# Patient Record
Sex: Male | Born: 1955 | Race: White | Hispanic: No | Smoking: Never smoker
Health system: Southern US, Community
[De-identification: ages and names within clinical notes are randomized; demographics above are authoritative.]

## PROBLEM LIST (undated history)

## (undated) DIAGNOSIS — E785 Hyperlipidemia, unspecified: Secondary | ICD-10-CM

## (undated) DIAGNOSIS — J449 Chronic obstructive pulmonary disease, unspecified: Secondary | ICD-10-CM

## (undated) DIAGNOSIS — M109 Gout, unspecified: Secondary | ICD-10-CM

## (undated) DIAGNOSIS — IMO0001 Reserved for inherently not codable concepts without codable children: Secondary | ICD-10-CM

## (undated) DIAGNOSIS — I10 Essential (primary) hypertension: Secondary | ICD-10-CM

## (undated) DIAGNOSIS — R0602 Shortness of breath: Secondary | ICD-10-CM

## (undated) DIAGNOSIS — G600 Hereditary motor and sensory neuropathy: Secondary | ICD-10-CM

## (undated) DIAGNOSIS — Z87442 Personal history of urinary calculi: Secondary | ICD-10-CM

## (undated) DIAGNOSIS — M549 Dorsalgia, unspecified: Secondary | ICD-10-CM

## (undated) DIAGNOSIS — M25512 Pain in left shoulder: Secondary | ICD-10-CM

## (undated) DIAGNOSIS — F419 Anxiety disorder, unspecified: Secondary | ICD-10-CM

## (undated) DIAGNOSIS — G8929 Other chronic pain: Secondary | ICD-10-CM

## (undated) DIAGNOSIS — K219 Gastro-esophageal reflux disease without esophagitis: Secondary | ICD-10-CM

## (undated) HISTORY — PX: CARDIAC CATHETERIZATION: SHX172

## (undated) HISTORY — DX: Hyperlipidemia, unspecified: E78.5

## (undated) HISTORY — DX: Essential (primary) hypertension: I10

## (undated) HISTORY — DX: Gastro-esophageal reflux disease without esophagitis: K21.9

## (undated) HISTORY — DX: Anxiety disorder, unspecified: F41.9

---

## 1998-04-10 ENCOUNTER — Emergency Department (HOSPITAL_COMMUNITY): Admission: EM | Admit: 1998-04-10 | Discharge: 1998-04-10 | Payer: Self-pay | Admitting: Emergency Medicine

## 1998-04-10 ENCOUNTER — Encounter: Payer: Self-pay | Admitting: Emergency Medicine

## 1998-07-28 ENCOUNTER — Inpatient Hospital Stay (HOSPITAL_COMMUNITY): Admission: AD | Admit: 1998-07-28 | Discharge: 1998-07-29 | Payer: Self-pay | Admitting: Cardiology

## 2000-02-29 ENCOUNTER — Inpatient Hospital Stay (HOSPITAL_COMMUNITY): Admission: EM | Admit: 2000-02-29 | Discharge: 2000-03-01 | Payer: Self-pay | Admitting: Cardiology

## 2000-07-17 ENCOUNTER — Emergency Department (HOSPITAL_COMMUNITY): Admission: EM | Admit: 2000-07-17 | Discharge: 2000-07-18 | Payer: Self-pay | Admitting: *Deleted

## 2000-07-18 ENCOUNTER — Encounter: Payer: Self-pay | Admitting: *Deleted

## 2000-07-18 ENCOUNTER — Encounter: Payer: Self-pay | Admitting: Internal Medicine

## 2000-07-20 ENCOUNTER — Ambulatory Visit (HOSPITAL_COMMUNITY): Admission: RE | Admit: 2000-07-20 | Discharge: 2000-07-20 | Payer: Self-pay | Admitting: General Surgery

## 2000-07-20 ENCOUNTER — Encounter: Payer: Self-pay | Admitting: General Surgery

## 2001-10-19 ENCOUNTER — Emergency Department (HOSPITAL_COMMUNITY): Admission: EM | Admit: 2001-10-19 | Discharge: 2001-10-19 | Payer: Self-pay | Admitting: Internal Medicine

## 2002-03-22 ENCOUNTER — Ambulatory Visit (HOSPITAL_COMMUNITY): Admission: RE | Admit: 2002-03-22 | Discharge: 2002-03-22 | Payer: Self-pay | Admitting: Pulmonary Disease

## 2003-06-04 ENCOUNTER — Encounter (HOSPITAL_COMMUNITY): Admission: RE | Admit: 2003-06-04 | Discharge: 2003-06-05 | Payer: Self-pay | Admitting: Internal Medicine

## 2004-07-26 ENCOUNTER — Ambulatory Visit: Payer: Self-pay | Admitting: Internal Medicine

## 2004-08-02 ENCOUNTER — Ambulatory Visit (HOSPITAL_COMMUNITY): Admission: RE | Admit: 2004-08-02 | Discharge: 2004-08-02 | Payer: Self-pay | Admitting: Internal Medicine

## 2004-08-02 ENCOUNTER — Ambulatory Visit: Payer: Self-pay | Admitting: Internal Medicine

## 2004-08-22 ENCOUNTER — Ambulatory Visit: Payer: Self-pay | Admitting: Internal Medicine

## 2005-03-09 ENCOUNTER — Emergency Department (HOSPITAL_COMMUNITY): Admission: EM | Admit: 2005-03-09 | Discharge: 2005-03-09 | Payer: Self-pay | Admitting: Emergency Medicine

## 2005-03-24 ENCOUNTER — Ambulatory Visit: Payer: Self-pay | Admitting: Cardiology

## 2005-03-24 ENCOUNTER — Ambulatory Visit (HOSPITAL_COMMUNITY): Admission: RE | Admit: 2005-03-24 | Discharge: 2005-03-24 | Payer: Self-pay | Admitting: Pulmonary Disease

## 2005-04-13 ENCOUNTER — Ambulatory Visit (HOSPITAL_COMMUNITY): Admission: RE | Admit: 2005-04-13 | Discharge: 2005-04-13 | Payer: Self-pay | Admitting: Pulmonary Disease

## 2005-05-29 ENCOUNTER — Ambulatory Visit: Payer: Self-pay | Admitting: Internal Medicine

## 2005-06-01 ENCOUNTER — Encounter (HOSPITAL_COMMUNITY): Admission: RE | Admit: 2005-06-01 | Discharge: 2005-07-01 | Payer: Self-pay | Admitting: Internal Medicine

## 2005-12-27 ENCOUNTER — Ambulatory Visit (HOSPITAL_COMMUNITY): Admission: RE | Admit: 2005-12-27 | Discharge: 2005-12-27 | Payer: Self-pay | Admitting: Pulmonary Disease

## 2009-12-13 ENCOUNTER — Encounter: Payer: Self-pay | Admitting: Cardiology

## 2009-12-15 ENCOUNTER — Encounter: Payer: Self-pay | Admitting: *Deleted

## 2009-12-15 ENCOUNTER — Ambulatory Visit: Payer: Self-pay | Admitting: Cardiovascular Disease

## 2009-12-15 DIAGNOSIS — R0602 Shortness of breath: Secondary | ICD-10-CM | POA: Insufficient documentation

## 2009-12-15 DIAGNOSIS — E1165 Type 2 diabetes mellitus with hyperglycemia: Secondary | ICD-10-CM | POA: Insufficient documentation

## 2009-12-15 DIAGNOSIS — R079 Chest pain, unspecified: Secondary | ICD-10-CM

## 2010-03-08 ENCOUNTER — Ambulatory Visit (HOSPITAL_COMMUNITY)
Admission: RE | Admit: 2010-03-08 | Discharge: 2010-03-08 | Payer: Self-pay | Source: Home / Self Care | Admitting: Pulmonary Disease

## 2010-04-24 ENCOUNTER — Encounter (INDEPENDENT_AMBULATORY_CARE_PROVIDER_SITE_OTHER): Payer: Self-pay | Admitting: Internal Medicine

## 2010-04-24 ENCOUNTER — Encounter: Payer: Self-pay | Admitting: Cardiovascular Disease

## 2010-05-03 NOTE — Assessment & Plan Note (Signed)
Summary: ***NP6 CHEST PAIN   Visit Type:  Initial Consult Primary Provider:  Nehemiah Settle  CC:  chest pain.  History of Present Illness: Dwayne Davies is seen today at the request of Dr Juanetta Gosling.  He has had atypical SSCP.  He has had two previously normal heart caths.  Last being in 2001.  He has been followed by Dr Daleen Squibb and Dorethea Clan in the past.  He  is sedentary and morbidly obese.  He lives with his son and neighter one cook so he has frozen dinners and eats out all the time.  He has type two diabetes and he doesnt watch his BS at home.  His pain is actually more left shoulder and arm.  It seems more muscular.  Chronic exertional dyspnea from weight   Clinical sleep apnea with loud snoring.  Mild chronic LE dependant edema.  Has had SSCP from relux and hiatal hernia in past but this is different.  Current Problems (verified): 1)  Chest Pain  (ICD-786.50) 2)  Dm  (ICD-250.00) 3)  Dyspnea  (ICD-786.05)  Current Medications (verified): 1)  Prilosec 20 Mg Cpdr (Omeprazole) .... Take 1 Tab Daily 2)  Xanax 0.25 Mg Tabs (Alprazolam) .... Take As Directed 3)  Glucovance 5-500 Mg Tabs (Glyburide-Metformin) .... Take 1 Tab Two Times A Day  Allergies (verified): No Known Drug Allergies  Past History:  Past Medical History: gerds anxiety chest pain Type 2 DM Elevated Lipids  Family History: No premature CAD  Social History: Tobacco Use - No.  Alcohol Use - no Regular Exercise - no Drug Use - no Widowed Lives with only son  Review of Systems       Denies fever, malais, weight loss, blurry vision, decreased visual acuity, cough, sputum, hemoptysis, pleuritic pain, palpitaitons, heartburn, abdominal pain, melena, lower extremity claudication, or rash.   Vital Signs:  Patient profile:   55 year old male Height:      65 inches Weight:      261 pounds BMI:     43.59 Pulse rate:   94 / minute BP sitting:   141 / 86  (right arm)  Vitals Entered By: Dreama Saa, CNA (December 15, 2009 9:36 AM)  Physical Exam  General:  Affect appropriate Healthy:  appears stated age HEENT: normal Neck supple with no adenopathy JVP normal no bruits no thyromegaly Lungs clear with no wheezing and good diaphragmatic motion Heart:  S1/S2 no murmur,rub, gallop or click PMI normal Abdomen: benighn, BS positve, no tenderness, no AAA no bruit.  No HSM or HJR Distal pulses intact with no bruits No edema Neuro non-focal Skin warm and dry    Impression & Recommendations:  Problem # 1:  CHEST PAIN (ICD-786.50) Atypical.  Lexiscan myovue  Does not think he can walk on treadmill Orders: Nuclear Stress Test (Nuc Stress Test)  Problem # 2:  DM (ICD-250.00) Encouraged him to monitor BS at home and explained to him the relationship between his obesity, poor diet and type 2 diabetes HbA1c quaterly.   His updated medication list for this problem includes:    Glucovance 5-500 Mg Tabs (Glyburide-metformin) .Marland Kitchen... Take 1 tab two times a day  Problem # 3:  DYSPNEA (ICD-786.05) Related to obesity.  Likely sleep apnea.  F/U primary to consider sleep study Orders: Nuclear Stress Test (Nuc Stress Test)  Patient Instructions: 1)  Your physician recommends that you schedule a follow-up appointment in: as needed  2)  Your physician recommends that you continue on your current  medications as directed. Please refer to the Current Medication list given to you today. 3)  Your physician has requested that you have an [employer]  For further information please visit [email or web address]  Please follow instruction sheet, as given.   EKG Report  Procedure date:  12/15/2009  Findings:      NSR 91 Poor R wave progression Abnormal ECG

## 2010-05-03 NOTE — Letter (Signed)
Summary: DR Juanetta Gosling PROGRESS NOTE 12/01/09  DR HAWKINS PROGRESS NOTE 12/01/09   Imported By: Faythe Ghee 12/15/2009 15:20:58  _____________________________________________________________________  External Attachment:    Type:   Image     Comment:   External Document

## 2010-05-03 NOTE — Letter (Signed)
Summary: DR Juanetta Gosling PROGRESS NOTE 12/01/09  DR HAWKINS PROGRESS NOTE 12/01/09   Imported By: Faythe Ghee 12/13/2009 12:33:45  _____________________________________________________________________  External Attachment:    Type:   Image     Comment:   External Document

## 2010-05-03 NOTE — Letter (Signed)
Summary: ekg 12/01/09  ekg 12/01/09   Imported By: Faythe Ghee 12/15/2009 15:20:31  _____________________________________________________________________  External Attachment:    Type:   Image     Comment:   External Document

## 2010-05-03 NOTE — Letter (Signed)
Summary: LABS 12/01/09  LABS 12/01/09   Imported By: Faythe Ghee 12/15/2009 15:21:15  _____________________________________________________________________  External Attachment:    Type:   Image     Comment:   External Document

## 2010-05-03 NOTE — Letter (Signed)
Summary: [facility] (Nuc Med Stress)  [facility]  [address], [place] [address]   Phone: [phone]  Fax: [phone]    Nuclear Medicine 1-Day Stress Test Information Sheet  Re:     Dwayne Davies   DOB:     03-Nov-1955 MRN:     [id number] Weight:  Appointment Date: Register at: Appointment Time: Referring MD:  ___Exercise Stress  __Adenosine   __Dobutamine  _X_Lexiscan  __Persantine   __Thallium  Urgency: ____1 (next day)   ____2 (one week)    ____3 (PRN)  Patient will receive Follow Up call with results: Patient needs follow-up appointment:  Instructions regarding medication:  How to prepare for your stress test: 1. DO NOT eat or dring 6 hours prior to your arrival time. This includes no caffeine (coffee, tea, sodas, chocolate) if you were instructed to take your medications, drink water with it. 2. DO NOT use any tobacco products for at leaset 8 hours prior to arrival. 3. DO NOT wear dresses or any clothing that may have metal clasps or buttons. 4. Wear short sleeve shirts, loose clothing, and comfortalbe walking shoes. 5. DO NOT use lotions, oils or powder on your chest before the test. 6. The test will take approximately 3-4 hours from the time you arrive until completion. 7. To register the day of the test, go to the Short Stay entrance at [facility]. 8. If you must cancel your test, call [phone] as soon as you are aware.  After you arrive for test:   When you arrive at [facility], you will go to Short Stay to be registered. They will then send you to Radiology to check in. The Nuclear Medicine Tech will get you and start an IV in your arm or hand. A small amount of a radioactive tracer will then be injected into your IV. This tracer will then have to circulate for 30-45 minutes. During this time you will wait in the waiting room and you will be able to drink something without caffeine. A series of pictures will be taken  of your heart follwoing this waiting period. After the 1st set of pictures you will go to the stress lab to get ready for your stress test. During the stress test, another small amount of a radioactive tracer will be injected through your IV. When the stress test is complete, there is a short rest period while your heart rate and blood pressure will be monitored. When this monitoring period is complete you will have another set of pictrues taken. (The same as the 1st set of pictures). These pictures are taken between 15 minutes and 1 hour after the stress test. The time depends on the type of stress test you had. Your doctor will inform you of your test results within 7 days after test.    The possibilities of certain changes are possible during the test. They include abnormal blood pressure and disorders of the heart. Side effects of persantine or adenosine can include flushing, chest pain, shortness of breath, stomach tightness, headache and light-headedness. These side effects usually do not last long and are self-resolving. Every effort will be made to keep you comfortable and to minimize complications by obtaining a medical history and by close observation during the test. Emergency equipment, medications, and trained personnel are available to deal with any unusual situation which may arise.  Please notify office at least 48 hours in advance if you are unable to keep  this appt.

## 2010-08-19 NOTE — Procedures (Signed)
NAME:  Dwayne Davies, BIRNIE NO.:  [id number]   MEDICAL RECORD NO.:  [id number]          PATIENT TYPE:  OUT   LOCATION:  RAD                           FACILITY:  [facility]   PHYSICIAN:  Olga Millers, M.D. LHCDATE OF BIRTH:  12-28-1955   DATE OF PROCEDURE:  03/24/2005  DATE OF DISCHARGE:  03/09/2005                                  ECHOCARDIOGRAM   HISTORY OF PRESENT ILLNESS:  The patient is a 55 year old male who is  complaining of dyspnea.  This study is performed to quantify left  ventricular function.  It is technically adequate.  The two dimensional  measurements are as follows:  Left ventricular end diastolic dimension is  4.3 cm, left ventricular end systolic dimension is 3.1 cm.  The septum and  posterior wall measure 1.3 cm.  The aorta is 3.4 cm.  The aorta is 3.4 cm.  The left atrium is 5 cm.   The overall left ventricular function appears to be normal with an estimated  ejection fraction of 55-60%.  There is mild concentric left ventricular  hypertrophy.  There is mild left atrial enlargement.  The right atrium and  right ventricle also appear to be mildly enlarged, and the right ventricular  function is moderately reduced.  There is no pericardial effusion.  The  aortic valve is mildly sclerotic, but there is no aortic stenosis or aortic  insufficiency.  There was no significant mitral regurgitation or tricuspid  regurgitation.   FINAL INTERPRETATION:  1.  Normal left ventricular function.  2.  Mild left ventricular hypertrophy.  3.  Mild biatrial enlargement.  4.  Mild right ventricular enlargement with moderately reduced RV function.  5.  No significant valvular regurgitation.           ______________________________  Olga Millers, M.D. [facility]     BC/MEDQ  D:  03/24/2005  T:  03/24/2005  Job:  [id number]

## 2010-08-19 NOTE — Op Note (Signed)
NAME:  [name], [name]                ACCOUNT NO.:  [id number]   MEDICAL RECORD NO.:  [id number]          PATIENT TYPE:  AMB   LOCATION:  DAY                           FACILITY:  [facility]   PHYSICIAN:  Lionel December, M.D.    DATE OF BIRTH:  1955/06/14   DATE OF PROCEDURE:  08/02/2004  DATE OF DISCHARGE:                                 OPERATIVE REPORT   PROCEDURE:  Esophagogastroduodenoscopy with esophageal dilation.   INDICATION:  Dwayne Davies is a 55 year old Caucasian male with chronic GERD who  also has intermittent solid food dysphagia. Since he has been on double dose  PPI, he has noted significant improvement but still has regurgitation in  addition the dysphagia. He has history of ulcerative esophagitis and  esophageal stricture.   Procedure risks were reviewed with the patient, and informed consent was  obtained.   PREMEDICATION:  Cetacaine spray for pharyngeal topical anesthesia, Demerol  50 mg IV, Versed 10 mg IV in divided dose.   FINDINGS:  Procedure performed in endoscopy suite. The patient's vital signs  and O2 saturation were monitored during the procedure and remained stable.  The patient was placed in left lateral position. Olympus videoscope was  passed via oropharynx without any difficulty into esophagus.   Esophagus. Mucosa of the proximal middle third was normal. Distally, there  were three discrete erosions just proximal to GE junction. There was a  stricture at GE junction which was at 37-38 cm. Hiatus was at 39-40. A small  sliding hiatal hernia was noted.   Stomach. It was empty and distended very well insufflation. Folds of  proximal stomach were normal. Examination of mucosa at body, antrum, pyloric  channel as well as angularis, fundus and cardia was normal.   Duodenum. Bulbar mucosa was normal. Scope was passed to the second part of  duodenum where mucosa and folds were normal. Endoscope was withdrawn.   Esophagus was dilated by passing 56-French Maloney  dilator to full  insertion. As the dilator was withdrawn and disrupted at three points.  Pictures taken for the record. Endoscope was withdrawn. The patient  tolerated the procedure well.   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 Stricture dilated to 56-French with  Dwayne Davies.   RECOMMENDATIONS:  1.  He will continue antireflux measures with Nexium 40 mg b.i.d.  2.  Solid phase gastric emptying study.      NR/MEDQ  D:  08/02/2004  T:  08/02/2004  Job:  [id number]   cc:   Juanetta Gosling, M.D.

## 2010-08-19 NOTE — Discharge Summary (Signed)
[place]  Patient:    Dwayne Davies, Dwayne Davies                       MRN: [id number] Adm. Date:  [id number] Disc. Date: 03/01/00 Attending:  Mirian Mo Dictator:   Brita Romp, P.A.C. CC:         Kari Baars, M.D.  at [address] [place], [place]  [address]  Jesse Sans. Wall, M.D. [facility]   Discharge Summary  DISCHARGE DIAGNOSES: 1. Gastroesophageal reflux disease. 2. Anxiety.  HOSPITAL COURSE:  The patient presented to the emergency room at [facility] on February 29, 2000 in the morning complaining of some exertional chest pain.  He was seen by Dr. Jesse Sans. Wall.  Dr. Jesse Sans. Wall felt that his symptoms were somewhat atypical but they did improve with nitroglycerin. Dr. Jesse Sans. Wall was concerned about possible CAD and arranged for the patient to be transported to [facility] for further evaluation.  The patient was taken to the catheterization lab on March 01, 2000 by Dr. Everardo Beals. Brodie.  The results were no significant coronary artery disease. Normal left ventricular function with good wall motion.  No areas of hypokinesis.  Ejection fraction estimated to be 60%.  Postcatheterization, the patient complained of some lower abdominal pain and inability to void.  The catheter site was also noted to be oozing some slightly.  There was no sign of swelling or aneurysm.  Initially, bed rest was extended to 10:30.  However, oozing continued somewhat.  Per Dr. Everardo Beals. Brodie bed rest was continued until 5:30 in the evening.  Later that afternoon, the patient reported a relief of abdominal pain with some raising the head of bed; however, there was still some minor oozing from the catheter site.  The dressing was removed using sterile technique.  The area adjacent to the site was infiltrated with approximately 1 cc of 1% lidocaine with epinephrine 1:100,000.  A sterile dressing was put in place and then  covered with an Op-Site.  Bed rest was continued until 5:30.  At that time, dressing was dry and intact. No evidence of hematoma or continued oozing.  DISCHARGE MEDICATIONS: 1. Prilosec 20 mg 1 q.d. 2. Xanax 0.25 mg p.r.n. as previously directed.  LABORATORY DATA:  Laboratory values from [facility] show white count 7.4, hemoglobin 16, hematocrit 46.9, platelets 304,000.  Cardiac enzymes negative for MI.  Sodium 139, potassium 4.0, chloride 101, CO2 27, BUN 9, creatinine 0.9, and glucose 96.  Noted a 2-D echocardiogram was performed late in the afternoon of March 01, 2000.  The results were not available at the time of dictation.  CONDITION ON DISCHARGE:  The patient was discharged home in stable condition.DD:  03/01/00 TD:  03/01/00 Job: [id number]

## 2010-08-19 NOTE — Procedures (Signed)
NAME:  Dwayne Davies, Dwayne Davies NO.:  [id number]   MEDICAL RECORD NO.:  [id number]                  PATIENT TYPE:   LOCATION:                                       FACILITY:   PHYSICIAN:  Pricilla Riffle, M.D.                 DATE OF BIRTH:   DATE OF PROCEDURE:  DATE OF DISCHARGE:                                    STRESS TEST   PROCEDURE:  Adenosine Cardiolite.   INDICATION:  The patient is a 55 year old gentleman with no known coronary  artery disease, with normal catheterization in November of 2001, now with  increased dyspnea on exertion.  His cardiac risk factors include a family  history of dyslipidemia and obesity.   BASELINE DATA:  EKG reveals sinus rhythm at 85 beats per minute with  nonspecific ST abnormalities.  Blood pressures 148/88.   Adenosine 60 mg was infused over a four minute protocol with Cardiolite  injected at three minutes.  The patient reported shortness of breath and  flushing which resolved in recovery.  EKG revealed no ischemic changes and  no arrhythmias.   FINAL IMAGES AND RESULTS:  Pending M.D. review.     ________________________________________  ___________________________________________  Jae Dire, P.A. [facility]                      Pricilla Riffle, M.D.   AB/MEDQ  D:  06/04/2003  T:  06/04/2003  Job:  [id number]

## 2010-08-19 NOTE — Procedures (Signed)
NAME:  [name], [name] NO.:  [id number]   MEDICAL RECORD NO.:  [id number]          PATIENT TYPE:  OUT   LOCATION:  RAD                           FACILITY:  [facility]   PHYSICIAN:  Edward L. Juanetta Gosling, M.D.DATE OF BIRTH:  [birth year]   DATE OF PROCEDURE:  04/13/2005  DATE OF DISCHARGE:                              PULMONARY FUNCTION TEST   RESULTS:  1.  Spirometry shows a moderate ventilatory defect without definite airflow      obstruction except at the level of the smaller airways.  2.  Lung volumes show reduction in total lung capacity which is of the same      magnitude as the ventilatory defect.  This is possibly related to body      habitus with a height of 67 inches and a weight of 256, but this patient      also has a diagnosis of muscular dystrophy and this may be part of that      syndrome as well.  3.  Diffusion capacity of carbon monoxide (DLCO) is mildly reduced.  4.  Arterial blood gases show relative resting hypoxemia.      Edward L. Juanetta Gosling, M.D.  Electronically Signed     ELH/MEDQ  D:  04/13/2005  T:  04/14/2005  Job:  [id number]

## 2010-08-19 NOTE — Consult Note (Signed)
NAME:  Dwayne Davies, Dwayne Davies NO.:  [id number]   MEDICAL RECORD NO.:  [id number]            PATIENT TYPE:   LOCATION:                                 FACILITY:   PHYSICIAN:  Lionel December, M.D.    DATE OF BIRTH:  28-Jun-1955   DATE OF CONSULTATION:  07/26/2004  DATE OF DISCHARGE:                                   CONSULTATION   REASON FOR CONSULTATION:  Reflux.   HISTORY OF PRESENT ILLNESS:  Dwayne Davies is a 55 year old Caucasian gentleman old Caucasian gentleman who  presents today for further evaluation and poorly controlled acid reflux  symptoms.  He has a history of ulcerative reflux esophagitis.  His last EGD  with Dr. Karilyn Cota was in 1997.  At that time he had ulcerative esophagitis  with an esophageal stricture at the EG junction.  The stomach was full of  food debris suggestive of gastroparesis, although the gastric emptying study  was normal.  The last time he was actually seen by our office was in 1997.  We were going to schedule him for an EGD in 2002.  However, he had this done  with Dr. Linna Darner instead.  He said he had his esophagus dilated at that time.  Currently he is having evening breakthrough acid reflux symptoms.  He notes  that this may be because he is eating more tomato based foods.  He also has  dysphasia to breads and meats.  He has acid regurgitation into his throat  and into his nose.  He denies any nausea and vomiting, abdominal pain,  constipation, diarrhea, melena, or rectal bleeding.  He has been on Nexium  40 mg daily but seems to develop symptoms in the afternoon or evenings.  He  has never had a colonoscopy.   CURRENT MEDICATIONS:  1.  Nexium 40 mg daily.  2.  Xanax 0.5 mg daily.  3.  Lotrel 10/20 mg daily.  4.  Blood pressure pill daily.  5.  Aspirin daily.   ALLERGIES:  No known drug allergies.   PAST MEDICAL HISTORY:  1.  Hypercholesterolemia.  2.  Hypertension.  3.  History of gout.  4.  History of reflux esophagitis as outlined above.  5.  Muscular  dystrophy.   PAST SURGICAL HISTORY:  He has had some sort of surgery on his stomach, he  is not quite sure.  He believes that he had a hernia repaired.  He is not  sure if he had an appendectomy.   FAMILY HISTORY:  Mother died of an MI.  Father had muscular dystrophy and  has passed away.  He has a brother with muscular dystrophy.   SOCIAL HISTORY:  He is divorced.  He has one child.  He does some part-time  work for [employer].  He has never been a smoker.  He  occasionally consumes alcohol several times a year.   REVIEW OF SYSTEMS:  GASTROINTESTINAL:  See HPI.  CARDIOPULMONARY:  Denies  any chest pain or shortness of breath.   PHYSICAL EXAMINATION:  VITAL SIGNS:  Weight 270, height 5 feet 7 inches.  Temperature 98.3, blood pressure 162/94, pulse 84.  GENERAL:  A pleasant, obese, Caucasian male in no acute distress.  He  appears older than his stated age.  SKIN:  Warm and dry.  No jaundice.  HEENT:  Conjunctivae are pink.  Sclerae nonicteric.  Oral pharyngeal mucosa  moist and pink.  No lesions, erythema, or exudates.  NECK:  No supraclavicular lymphadenopathy.  CHEST/LUNGS:  Clear to auscultation.  CARDIAC:  Reveals a regular rate and rhythm.  Normal S1 S2.  No murmurs,  rubs, or gallops.  ABDOMEN:  Positive bowel sounds.  Obese but symmetrical and soft.  Nontender.  No organomegaly or masses appreciated but limited due to body  habitus.  No rebound tenderness or guarding.  No abdominal bruits.  EXTREMITIES:  No edema.   IMPRESSION:  Dwayne Davies is a 55 year old gentleman old gentleman with a several month history of  poorly controlled acid reflux symptoms.  He has typical heartburn symptoms,  acid regurgitation, water brash, and dysphasia to solid foods.  It has been  several years since his last upper endoscopy with esophageal dilatation.  He  needs to have another esophagogastroduodenoscopy at this time.  I discussed  the risks, alternatives, and benefits with regards to but not  limited to the  risk of reaction to medications, bleeding, infection, and perforation.  He  is agreeable to proceed.  We also discussed a screening colonoscopy when he  turns age 71.   PLAN:  1.  EGD with esophageal dilatation in the near future.  2.  He will continue Nexium 40 mg daily for now.  We may need to increase      this to b.i.d. dosing.  However, we will await the EGD findings.      LL/MEDQ  D:  07/26/2004  T:  07/26/2004  Job:  [id number]

## 2010-08-19 NOTE — Cardiovascular Report (Signed)
. [facility]  Patient:    Dwayne Davies, Dwayne Davies                       MRN: [id number] Proc. Date: 03/01/00 Adm. Date:  [id number] Attending:  Mirian Mo CC:         Kari Baars, M.D.  Thomas C. Wall, M.D. [facility]  ________________   Cardiac Catheterization  CLINICAL HISTORY:  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  He was seen by ______ and Dr. Jesse Davies. Wall, who were concerned that his symptoms may be related to myocardial ischemia and scheduled him for further evaluation with catheterization.  DESCRIPTION OF PROCEDURE:  The procedure was performed via the right femoral artery using arterial sheath and 6-French preformed coronary catheters.  A femoral arterial puncture was performed and Omnipaque contrast was used.  The right femoral artery was closed with Perclose at the end of the procedure. Patient tolerated the procedure well and left the laboratory in satisfactory condition.  RESULTS:  The aortic pressure was 126/79 with a mean of 96.  Left ventricular pressure was 126/14.  Left main coronary artery:  The left main coronary artery was free of significant disease.  Left anterior descending artery:  The left anterior descending artery gave rise to two diagonal branches and three septal perforators.  These and the LAD proper were free of significant disease.  Left circumflex artery:  The left circumflex artery gave rise to a marginal branch and two posterolateral branches.  These vessels were free of significant disease.  Right coronary artery:  The right coronary artery was a small vessel that gave rise to a conus branch, two right ventricular branches and a very small posterior descending and posterolateral branch.  These vessels were free of significant disease.  Left ventriculogram:  The left  ventriculogram performed in the RAO projection showed good wall motion with no areas of hypokinesis.  The ejection fraction was 60%.  CONCLUSION:  Normal coronary angiography and left ventricular wall motion.  RECOMMENDATIONS:  Reassurance.  The patient does have symptoms of reflux and feels that Zantac has not controlled these and his symptoms could possibly be related to this.  We will plan to change his Zantac to Prilosec and [facility]  We will arrange followup with Dr. Kari Baars. DD:  03/01/00 TD:  03/01/00 Job: [id number]

## 2011-03-31 ENCOUNTER — Encounter: Payer: Self-pay | Admitting: Cardiology

## 2011-05-30 ENCOUNTER — Encounter (HOSPITAL_COMMUNITY)
Admission: RE | Admit: 2011-05-30 | Discharge: 2011-05-30 | Payer: Medicare Other | Source: Ambulatory Visit | Attending: Ophthalmology | Admitting: Ophthalmology

## 2011-05-30 NOTE — Patient Instructions (Signed)
20 Dwayne Davies  05/30/2011   Your procedure is scheduled on:  06/05/11  Report to [facility] at 07:00 AM.  Call this number if you have problems the morning of surgery: [phone]   Remember:   Do not eat food:After Midnight.  May have clear liquids:until Midnight .  Clear liquids include soda, tea, black coffee, apple or grape juice, broth.  Take these medicines the morning of surgery with A SIP OF WATER:    Do not wear jewelry, make-up or nail polish.  Do not wear lotions, powders, or perfumes. You may wear deodorant.  Do not shave 48 hours prior to surgery.  Do not bring valuables to the hospital.  Contacts, dentures or bridgework may not be worn into surgery.  Leave suitcase in the car. After surgery it may be brought to your room.  For patients admitted to the hospital, checkout time is 11:00 AM the day of discharge.   Patients discharged the day of surgery will not be allowed to drive home.  Name and phone number of your driver:   Special Instructions: N/A   Please read over the following fact sheets that you were given: Anesthesia Post-op Instructions and Care and Recovery After Surgery    Cataract A cataract is a clouding of the lens of the eye. It is most often related to aging. A cataract is not a "film" over the surface of the eye. The lens is inside the eye and changes size of the pupil. The lens can enlarge to let more light enter the eye in dark environments and contract the size of the pupil to let in bright light. The lens is the part of the eye that helps focus light on the retina. The retina is the eye's light-sensitive layer. It is in the back of the eye that sends visual signals to the brain. In a normal eye, light passes through the lens and gets focused on the retina. To help produce a sharp image, the lens must remain clear. When a lens becomes cloudy, vision is compromised by the degree and nature of the clouding. Certain cataracts make people more near-sighted as  they develop, others increase glare, and all reduce vision to some degree or another. A cataract that is so dense that it becomes milky Sawyer Kahan and a Franceska Strahm opacity can be seen through the pupil. When the Randie Tallarico color is seen, it is called a "mature" or "hyper-mature cataract." Such cataracts cause total blindness in the affected eye. The cataract must be removed to prevent damage to the eye itself. Some types of cataracts can cause a secondary disease of the eye, such as certain types of glaucoma. In the early stages, better lighting and eyeglasses may lessen vision problems caused by cataracts. At a certain point, surgery may be needed to improve vision. CAUSES   Aging. However, cataracts may occur at any age, even in newborns.   Certain drugs.   Trauma to the eye.   Certain diseases (such as diabetes).   Inherited or acquired medical syndromes.  SYMPTOMS   Gradual, progressive drop in vision in the affected eye. Cataracts may develop at different rates in each eye. Cataracts may even be in just one eye with the other unaffected.   Cataracts due to trauma may develop quickly, sometimes over a matter or days or even hours. The result is severe and rapid visual loss.  DIAGNOSIS  To detect a cataract, an eye doctor examines the lens. A well developed cataract can be  diagnosed without dilating the pupil. Early cataracts and others of a specific nature are best diagnosed with an exam of the eyes with the pupils dilated by drops. TREATMENT   For an early cataract, vision may improve by using different eyeglasses or stronger lighting.   If the above measures do not help, surgery is the only effective treatment. This treatment removes the cloudy lens and replaces it with a substitute lens (Intraocular lens, or IOL). Newly developed IOL technology allows the implanted lens to improve vision both at a distance and up close. Discuss with your eye surgeon about the possibility of still needing glasses.  Also discuss how visual coordination between both eyes will be affected.  A cataract needs to be removed only when vision loss interferes with your everyday activities such as driving, reading or watching TV. You and your eye doctor can make that decision together. In most cases, waiting until you are ready to have cataract surgery will not harm your eye. If you have cataracts in both eyes, only one should be removed at a time. This allows the operated eye to heal and be out of danger from serious problems (such as infection or poor wound healing) before having the other eye undergo surgery.  Sometimes, a cataract should be removed even if it does not cause problems with your vision. For example, a cataract should be removed if it prevents examination or treatment of another eye problem. Just as you cannot see out of the affected eye well, your doctor cannot see into your eye well through a cataract. The vast majority of people who have cataract surgery have better vision afterward. CATARACT REMOVAL There are two primary ways to remove a cataract. Your doctor can explain the differences and help determine which is best for you:  Phacoemulsification (small incision cataract surgery). This involves making a small cut (incision) on the edge of the clear, dome-shaped surface that covers the front of the eye (the cornea). An injection behind the eye or eye drops are given to make this a painless procedure. The doctor then inserts a tiny probe into the eye. This device emits ultrasound waves that soften and break up the cloudy center of the lens so it can be removed by suction. Most cataract surgery is done this way. The cuts are usually so small and performed in such a manner that often no sutures are needed to keep it closed.   Extracapsular surgery. Your doctor makes a slightly longer incision on the side of the cornea. The doctor removes the hard center of the lens. The remainder of the lens is then removed  by suction. In some cases, extremely fine sutures are needed which the doctor may, or may not remove in the office after the surgery.  When an IOL is implanted, it needs no care. It becomes a permanent part of your eye and cannot be seen or felt.  Some people cannot have an IOL. They may have problems during surgery, or maybe they have another eye disease. For these people, a soft contact lens may be suggested. If an IOL or contact lens cannot be used, very powerful and thick glasses are required after surgery. Since vision is very different through such thick glasses, it is important to have your doctor discuss the impact on your vision after any cataract surgery where there is no plan to implant an IOL. The normal lens of the eye is covered by a clear capsule. Both phacoemulsification and extracapsular surgery require that  the back surface of this lens capsule be left in place. This helps support IOLs and prevents the IOL from dislocating and falling back into the deeper interior of the eye. Right after surgery, and often permanently this "posterior capsule" remains clear. In some cases however, it can become cloudy, presenting the same type of visual compromise that the original cataract did since light is again obstructed as it passes through the clear IOL. This condition is often referred to as an "after-cataract." Fortunately, after-cataracts are easily treated using a painless and very fast laser treatment that is performed without anesthesia or incisions. It is done in a matter of minutes in an outpatient environment. Visual improvement is often immediate.  HOME CARE INSTRUCTIONS   Your surgeon will discuss pre and post operative care with you prior to surgery. The majority of people are able to do almost all normal activities right away. Although, it is often advised to avoid strenuous activity for a period of time.   Postoperative drops and careful avoidance of infection will be needed. Many  surgeons suggest the use of a protective shield during the first few days after surgery.   There is a very small incidence of complication from modern cataract surgery, but it can happen. Infection that spreads to the inside of the eye (endophthalmitis) can result in total visual loss and even loss of the eye itself. In extremely rare instances, the inflammation of endophthalmitis can spread to both eyes (sympathetic ophthalmia). Appropriate post-operative care under the close observation of your surgeon is essential to a successful outcome.  SEEK IMMEDIATE MEDICAL CARE IF:   You have any sudden drop of vision in the operated eye.   You have pain in the operated eye.   You see a large number of floating dots in the field of vision in the operated eye.   You see flashing lights, or if a portion of your side vision in any direction appears black (like a curtain being drawn into your field of vision) in the operated eye.  Document Released: 03/20/2005 Document Revised: 11/30/2010 Document Reviewed: 05/06/2007 [facility] Patient Information 2012 [place], [place].PATIENT INSTRUCTIONS POST-ANESTHESIA  IMMEDIATELY FOLLOWING SURGERY:  Do not drive or operate machinery for the first twenty four hours after surgery.  Do not make any important decisions for twenty four hours after surgery or while taking narcotic pain medications or sedatives.  If you develop intractable nausea and vomiting or a severe headache please notify your doctor immediately.  FOLLOW-UP:  Please make an appointment with your surgeon as instructed. You do not need to follow up with anesthesia unless specifically instructed to do so.  WOUND CARE INSTRUCTIONS (if applicable):  Keep a dry clean dressing on the anesthesia/puncture wound site if there is drainage.  Once the wound has quit draining you may leave it open to air.  Generally you should leave the bandage intact for twenty four hours unless there is drainage.  If the epidural site  drains for more than 36-48 hours please call the anesthesia department.  QUESTIONS?:  Please feel free to call your physician or the hospital operator if you have any questions, and they will be happy to assist you.     [facility] Anesthesia Department [address] [place] [phone]

## 2011-06-05 ENCOUNTER — Ambulatory Visit (HOSPITAL_COMMUNITY): Admission: RE | Admit: 2011-06-05 | Payer: Medicare Other | Source: Ambulatory Visit | Admitting: Ophthalmology

## 2011-06-05 ENCOUNTER — Encounter (HOSPITAL_COMMUNITY): Admission: RE | Payer: Self-pay | Source: Ambulatory Visit

## 2011-06-05 SURGERY — PHACOEMULSIFICATION, CATARACT, WITH IOL INSERTION
Anesthesia: Monitor Anesthesia Care | Laterality: Right

## 2011-06-05 MED ORDER — EPINEPHRINE HCL 1 MG/ML IJ SOLN
INTRAMUSCULAR | Status: AC
  Filled 2011-06-05: qty 1

## 2011-07-04 ENCOUNTER — Ambulatory Visit (HOSPITAL_COMMUNITY)
Admission: RE | Admit: 2011-07-04 | Discharge: 2011-07-04 | Disposition: A | Discharge status: Home / Self Care | Payer: Medicare Other | Source: Ambulatory Visit | Attending: Pulmonary Disease | Admitting: Pulmonary Disease

## 2011-07-04 DIAGNOSIS — R0989 Other specified symptoms and signs involving the circulatory and respiratory systems: Secondary | ICD-10-CM | POA: Insufficient documentation

## 2011-07-04 DIAGNOSIS — R0609 Other forms of dyspnea: Secondary | ICD-10-CM | POA: Insufficient documentation

## 2011-07-04 DIAGNOSIS — E119 Type 2 diabetes mellitus without complications: Secondary | ICD-10-CM | POA: Insufficient documentation

## 2011-07-04 DIAGNOSIS — I517 Cardiomegaly: Secondary | ICD-10-CM

## 2011-07-04 NOTE — Progress Notes (Signed)
*  PRELIMINARY RESULTS* Echocardiogram 2D Echocardiogram has been performed.  Dwayne Davies 07/04/2011, 8:16 AM

## 2012-04-02 ENCOUNTER — Encounter (HOSPITAL_COMMUNITY): Payer: Self-pay

## 2012-04-02 ENCOUNTER — Encounter (HOSPITAL_COMMUNITY): Payer: Self-pay | Admitting: Pharmacy Technician

## 2012-04-02 ENCOUNTER — Encounter (HOSPITAL_COMMUNITY)
Admission: RE | Admit: 2012-04-02 | Discharge: 2012-04-02 | Disposition: A | Discharge status: Home / Self Care | Payer: Medicare Other | Source: Ambulatory Visit | Attending: Ophthalmology | Admitting: Ophthalmology

## 2012-04-02 DIAGNOSIS — H2510 Age-related nuclear cataract, unspecified eye: Secondary | ICD-10-CM | POA: Insufficient documentation

## 2012-04-02 DIAGNOSIS — Z01810 Encounter for preprocedural cardiovascular examination: Secondary | ICD-10-CM | POA: Insufficient documentation

## 2012-04-02 DIAGNOSIS — I10 Essential (primary) hypertension: Secondary | ICD-10-CM | POA: Insufficient documentation

## 2012-04-02 DIAGNOSIS — E119 Type 2 diabetes mellitus without complications: Secondary | ICD-10-CM | POA: Insufficient documentation

## 2012-04-02 DIAGNOSIS — Z01812 Encounter for preprocedural laboratory examination: Secondary | ICD-10-CM | POA: Insufficient documentation

## 2012-04-02 HISTORY — DX: Shortness of breath: R06.02

## 2012-04-02 HISTORY — DX: Gout, unspecified: M10.9

## 2012-04-02 LAB — BASIC METABOLIC PANEL
BUN: 6 mg/dL (ref 6–23)
CO2: 31 mEq/L (ref 19–32)
Calcium: 9.5 mg/dL (ref 8.4–10.5)
Chloride: 93 mEq/L — ABNORMAL LOW (ref 96–112)
Creatinine, Ser: 0.63 mg/dL (ref 0.50–1.35)
GFR calc Af Amer: >90 mL/min (ref >90)
GFR calc non Af Amer: >90 mL/min (ref >90)
Glucose, Bld: 433 mg/dL — ABNORMAL HIGH (ref 70–99)
Potassium: 4.1 mEq/L (ref 3.5–5.1)
Sodium: 132 mEq/L — ABNORMAL LOW (ref 135–145)

## 2012-04-02 LAB — HEMOGLOBIN AND HEMATOCRIT, BLOOD
HCT: 48.2 % (ref 39.0–52.0)
Hemoglobin: 16.8 g/dL (ref 13.0–17.0)

## 2012-04-02 MED ORDER — CYCLOPENTOLATE-PHENYLEPHRINE 0.2-1 % OP SOLN
OPHTHALMIC | Status: AC
  Filled 2012-04-02: qty 2

## 2012-04-02 NOTE — Patient Instructions (Addendum)
Your procedure is scheduled on:  04/08/2012  Report to [facility] at 8:00     AM.  Call this number if you have problems the morning of surgery: [phone]   Remember:   Do not eat or drink :After Midnight.    Take these medicines the morning of surgery with A SIP OF WATER: Xanax ,Prilosec and use albuterol inhaler   Do not wear jewelry, make-up or nail polish.  Do not wear lotions, powders, or perfumes. You may wear deodorant.  Do not shave 48 hours prior to surgery.  Do not bring valuables to the hospital.  Contacts, dentures or bridgework may not be worn into surgery.  Patients discharged the day of surgery will not be allowed to drive home.  Name and phone number of your driver:    Please read over the following fact sheets that you were given: Pain Booklet, Surgical Site Infection Prevention, Anesthesia Post-op Instructions and Care and Recovery After Surgery  Cataract Surgery  A cataract is a clouding of the lens of the eye. When a lens becomes cloudy, vision is reduced based on the degree and nature of the clouding. Surgery may be needed to improve vision. Surgery removes the cloudy lens and usually replaces it with a substitute lens (intraocular lens, IOL). LET YOUR EYE DOCTOR KNOW ABOUT:  Allergies to food or medicine.   Medicines taken including herbs, eyedrops, over-the-counter medicines, and creams.   Use of steroids (by mouth or creams).   Previous problems with anesthetics or numbing medicine.   History of bleeding problems or blood clots.   Previous surgery.   Other health problems, including diabetes and kidney problems.   Possibility of pregnancy, if this applies.  RISKS AND COMPLICATIONS  Infection.   Inflammation of the eyeball (endophthalmitis) that can spread to both eyes (sympathetic ophthalmia).   Poor wound healing.   If an IOL is inserted, it can later fall out of proper position. This is very uncommon.   Clouding of the part of your eye that  holds an IOL in place. This is called an "after-cataract." These are uncommon, but easily treated.  BEFORE THE PROCEDURE  Do not eat or drink anything except small amounts of water for 8 to 12 before your surgery, or as directed by your caregiver.   Unless you are told otherwise, continue any eyedrops you have been prescribed.   Talk to your primary caregiver about all other medicines that you take (both prescription and non-prescription). In some cases, you may need to stop or change medicines near the time of your surgery. This is most important if you are taking blood-thinning medicine.Do not stop medicines unless you are told to do so.   Arrange for someone to drive you to and from the procedure.   Do not put contact lenses in either eye on the day of your surgery.  PROCEDURE There is more than one method for safely removing a cataract. Your doctor can explain the differences and help determine which is best for you. Phacoemulsification surgery is the most common form of cataract surgery.  An injection is given behind the eye or eyedrops are given to make this a painless procedure.   A small cut (incision) is made on the edge of the clear, dome-shaped surface that covers the front of the eye (cornea).   A tiny probe is painlessly inserted into the eye. This device gives off ultrasound waves that soften and break up the cloudy center of the lens.  This makes it easier for the cloudy lens to be removed by suction.   An IOL may be implanted.   The normal lens of the eye is covered by a clear capsule. Part of that capsule is intentionally left in the eye to support the IOL.   Your surgeon may or may not use stitches to close the incision.  There are other forms of cataract surgery that require a larger incision and stiches to close the eye. This approach is taken in cases where the doctor feels that the cataract cannot be easily removed using phacoemulsification. AFTER THE  PROCEDURE  When an IOL is implanted, it does not need care. It becomes a permanent part of your eye and cannot be seen or felt.   Your doctor will schedule follow-up exams to check on your progress.   Review your other medicines with your doctor to see which can be resumed after surgery.   Use eyedrops or take medicine as prescribed by your doctor.  Document Released: 03/09/2011 Document Reviewed: 03/06/2011 [facility] Patient Information 2012 [place].  .Cataract Surgery Care After Refer to this sheet in the next few weeks. These instructions provide you with information on caring for yourself after your procedure. Your caregiver may also give you more specific instructions. Your treatment has been planned according to current medical practices, but problems sometimes occur. Call your caregiver if you have any problems or questions after your procedure.  HOME CARE INSTRUCTIONS   Avoid strenuous activities as directed by your caregiver.   Ask your caregiver when you can resume driving.   Use eyedrops or other medicines to help healing and control pressure inside your eye as directed by your caregiver.   Only take over-the-counter or prescription medicines for pain, discomfort, or fever as directed by your caregiver.   Do not to touch or rub your eyes.   You may be instructed to use a protective shield during the first few days and nights after surgery. If not, wear sunglasses to protect your eyes. This is to protect the eye from pressure or from being accidentally bumped.   Keep the area around your eye clean and dry. Avoid swimming or allowing water to hit you directly in the face while showering. Keep soap and shampoo out of your eyes.   Do not bend or lift heavy objects. Bending increases pressure in the eye. You can walk, climb stairs, and do light household chores.   Do not put a contact lens into the eye that had surgery until your caregiver says it is okay to do so.    Ask your doctor when you can return to work. This will depend on the kind of work that you do. If you work in a dusty environment, you may be advised to wear protective eyewear for a period of time.   Ask your caregiver when it will be safe to engage in sexual activity.   Continue with your regular eye exams as directed by your caregiver.  What to expect:  It is normal to feel itching and mild discomfort for a few days after cataract surgery. Some fluid discharge is also common, and your eye may be sensitive to light and touch.   After 1 to 2 days, even moderate discomfort should disappear. In most cases, healing will take about 6 weeks.   If you received an intraocular lens (IOL), you may notice that colors are very bright or have a blue tinge. Also, if you have been in  bright sunlight, everything may appear reddish for a few hours. If you see these color tinges, it is because your lens is clear and no longer cloudy. Within a few months after receiving an IOL, these extra colors should go away. When you have healed, you will probably need new glasses.  SEEK MEDICAL CARE IF:   You have increased bruising around your eye.   You have discomfort not helped by medicine.  SEEK IMMEDIATE MEDICAL CARE IF:   You have a fever.   You have a worsening or sudden vision loss.   You have redness, swelling, or increasing pain in the eye.   You have a thick discharge from the eye that had surgery.  MAKE SURE YOU:  Understand these instructions.   Will watch your condition.   Will get help right away if you are not doing well or get worse.  Document Released: 10/07/2004 Document Revised: 03/09/2011 Document Reviewed: 11/11/2010 [facility] Patient Information 2012 [place], [place].

## 2012-04-02 NOTE — Progress Notes (Signed)
04/02/12 1322  OBSTRUCTIVE SLEEP APNEA  Have you ever been diagnosed with sleep apnea through a sleep study? No  Do you snore loudly (loud enough to be heard through closed doors)?  0  Do you often feel tired, fatigued, or sleepy during the daytime? 0  Has anyone observed you stop breathing during your sleep? 0  Do you have, or are you being treated for high blood pressure? 1  BMI more than 35 kg/m2? 1  Age over 55 years old? 1  Neck circumference greater than 40 cm/18 inches? 1  Gender: 1  Obstructive Sleep Apnea Score 5   Score 4 or greater  Results sent to PCP

## 2012-04-08 ENCOUNTER — Ambulatory Visit (HOSPITAL_COMMUNITY)
Admission: RE | Admit: 2012-04-08 | Discharge: 2012-04-08 | Disposition: A | Discharge status: Home / Self Care | Payer: Medicare Other | Source: Ambulatory Visit | Attending: Ophthalmology | Admitting: Ophthalmology

## 2012-04-08 ENCOUNTER — Ambulatory Visit (HOSPITAL_COMMUNITY): Payer: Medicare Other | Admitting: Anesthesiology

## 2012-04-08 ENCOUNTER — Encounter (HOSPITAL_COMMUNITY): Payer: Self-pay | Admitting: Anesthesiology

## 2012-04-08 ENCOUNTER — Encounter (HOSPITAL_COMMUNITY)
Admission: RE | Disposition: A | Discharge status: Home / Self Care | Payer: Self-pay | Source: Ambulatory Visit | Attending: Ophthalmology

## 2012-04-08 DIAGNOSIS — I10 Essential (primary) hypertension: Secondary | ICD-10-CM | POA: Insufficient documentation

## 2012-04-08 DIAGNOSIS — H2510 Age-related nuclear cataract, unspecified eye: Secondary | ICD-10-CM | POA: Insufficient documentation

## 2012-04-08 DIAGNOSIS — E119 Type 2 diabetes mellitus without complications: Secondary | ICD-10-CM | POA: Insufficient documentation

## 2012-04-08 DIAGNOSIS — Z01812 Encounter for preprocedural laboratory examination: Secondary | ICD-10-CM | POA: Insufficient documentation

## 2012-04-08 HISTORY — PX: CATARACT EXTRACTION W/PHACO: SHX586

## 2012-04-08 LAB — GLUCOSE, CAPILLARY: Glucose-Capillary: 283 mg/dL — ABNORMAL HIGH (ref 70–99)

## 2012-04-08 SURGERY — PHACOEMULSIFICATION, CATARACT, WITH IOL INSERTION
Anesthesia: Monitor Anesthesia Care | Site: Eye | Laterality: Right | Wound class: Clean

## 2012-04-08 MED ORDER — TRYPAN BLUE 0.06 % OP SOLN
OPHTHALMIC | Status: AC
  Filled 2012-04-08: qty 0.5

## 2012-04-08 MED ORDER — NA HYALUR & NA CHOND-NA HYALUR 0.55-0.5 ML IO KIT
PACK | INTRAOCULAR | Status: DC | PRN
  Administered 2012-04-08: 1 via OPHTHALMIC

## 2012-04-08 MED ORDER — LIDOCAINE HCL 3.5 % OP GEL
OPHTHALMIC | Status: AC
  Filled 2012-04-08: qty 5

## 2012-04-08 MED ORDER — LIDOCAINE 3.5 % OP GEL OPTIME - NO CHARGE
OPHTHALMIC | Status: DC | PRN
  Administered 2012-04-08: 1 [drp] via OPHTHALMIC

## 2012-04-08 MED ORDER — MIDAZOLAM HCL 2 MG/2ML IJ SOLN
INTRAMUSCULAR | Status: AC
  Filled 2012-04-08: qty 2

## 2012-04-08 MED ORDER — MIDAZOLAM HCL 2 MG/2ML IJ SOLN
1.0000 mg | INTRAMUSCULAR | Status: DC | PRN
  Administered 2012-04-08 (×2): 1 mg via INTRAVENOUS

## 2012-04-08 MED ORDER — MIDAZOLAM HCL 5 MG/5ML IJ SOLN
INTRAMUSCULAR | Status: DC | PRN
  Administered 2012-04-08: 2 mg via INTRAVENOUS
  Administered 2012-04-08 (×2): 1 mg via INTRAVENOUS

## 2012-04-08 MED ORDER — GATIFLOXACIN 0.5 % OP SOLN OPTIME - NO CHARGE
OPHTHALMIC | Status: DC | PRN
  Administered 2012-04-08: 3 [drp] via OPHTHALMIC

## 2012-04-08 MED ORDER — NEOSTIGMINE METHYLSULFATE 1 MG/ML IJ SOLN
INTRAMUSCULAR | Status: AC
  Filled 2012-04-08: qty 1

## 2012-04-08 MED ORDER — GATIFLOXACIN 0.5 % OP SOLN
1.0000 [drp] | Freq: Once | OPHTHALMIC | Status: AC
  Administered 2012-04-08: 1 [drp] via OPHTHALMIC

## 2012-04-08 MED ORDER — TETRACAINE HCL 0.5 % OP SOLN
OPHTHALMIC | Status: AC
  Filled 2012-04-08: qty 2

## 2012-04-08 MED ORDER — CYCLOPENTOLATE-PHENYLEPHRINE 0.2-1 % OP SOLN
1.0000 [drp] | Freq: Once | OPHTHALMIC | Status: AC
  Administered 2012-04-08: 1 [drp] via OPHTHALMIC

## 2012-04-08 MED ORDER — LACTATED RINGERS IV SOLN
INTRAVENOUS | Status: DC
  Administered 2012-04-08: 08:00:00 via INTRAVENOUS

## 2012-04-08 MED ORDER — GLYCOPYRROLATE 0.2 MG/ML IJ SOLN
INTRAMUSCULAR | Status: AC
  Filled 2012-04-08: qty 1

## 2012-04-08 MED ORDER — TRYPAN BLUE 0.06 % OP SOLN
OPHTHALMIC | Status: DC | PRN
  Administered 2012-04-08: .2 mL via INTRAOCULAR

## 2012-04-08 MED ORDER — BSS IO SOLN
INTRAOCULAR | Status: DC | PRN
  Administered 2012-04-08: 15 mL via INTRAOCULAR

## 2012-04-08 MED ORDER — LIDOCAINE HCL 3.5 % OP GEL
1.0000 "application " | Freq: Once | OPHTHALMIC | Status: AC
  Administered 2012-04-08: 1 via OPHTHALMIC

## 2012-04-08 MED ORDER — TETRACAINE 0.5 % OP SOLN OPTIME - NO CHARGE
OPHTHALMIC | Status: DC | PRN
  Administered 2012-04-08: 1 [drp] via OPHTHALMIC

## 2012-04-08 MED ORDER — EPINEPHRINE HCL 1 MG/ML IJ SOLN
INTRAOCULAR | Status: DC | PRN
  Administered 2012-04-08: 09:00:00

## 2012-04-08 MED ORDER — EPINEPHRINE HCL 1 MG/ML IJ SOLN
INTRAMUSCULAR | Status: AC
  Filled 2012-04-08: qty 1

## 2012-04-08 MED ORDER — GLYCOPYRROLATE 0.2 MG/ML IJ SOLN
INTRAMUSCULAR | Status: AC
  Filled 2012-04-08: qty 3

## 2012-04-08 MED ORDER — TETRACAINE HCL 0.5 % OP SOLN
1.0000 [drp] | Freq: Once | OPHTHALMIC | Status: AC
  Administered 2012-04-08: 1 [drp] via OPHTHALMIC

## 2012-04-08 SURGICAL SUPPLY — 27 items
CAPSULAR TENSION RING-AMO (OPHTHALMIC RELATED) IMPLANT
CLOTH BEACON ORANGE TIMEOUT ST (SAFETY) ×1 IMPLANT
GLOVE BIO SURGEON STRL SZ7.5 (GLOVE) ×1 IMPLANT
GLOVE BIOGEL M 6.5 STRL (GLOVE) IMPLANT
GLOVE BIOGEL PI IND STRL 6.5 (GLOVE) IMPLANT
GLOVE BIOGEL PI IND STRL 7.0 (GLOVE) IMPLANT
GLOVE BIOGEL PI INDICATOR 6.5 (GLOVE)
GLOVE BIOGEL PI INDICATOR 7.0 (GLOVE) ×1
GLOVE ECLIPSE 6.5 STRL STRAW (GLOVE) IMPLANT
GLOVE ECLIPSE 7.5 STRL STRAW (GLOVE) IMPLANT
GLOVE EXAM NITRILE LRG STRL (GLOVE) IMPLANT
GLOVE EXAM NITRILE MD LF STRL (GLOVE) ×1 IMPLANT
GLOVE SKINSENSE NS SZ6.5 (GLOVE)
GLOVE SKINSENSE NS SZ7.0 (GLOVE)
GLOVE SKINSENSE STRL SZ6.5 (GLOVE) IMPLANT
GLOVE SKINSENSE STRL SZ7.0 (GLOVE) IMPLANT
INST SET CATARACT ~~LOC~~ (KITS) ×2 IMPLANT
KIT VITRECTOMY (OPHTHALMIC RELATED) IMPLANT
PAD ARMBOARD 7.5X6 YLW CONV (MISCELLANEOUS) ×1 IMPLANT
PROC W NO LENS (INTRAOCULAR LENS)
PROC W SPEC LENS (INTRAOCULAR LENS)
PROCESS W NO LENS (INTRAOCULAR LENS) IMPLANT
PROCESS W SPEC LENS (INTRAOCULAR LENS) IMPLANT
RING MALYGIN (MISCELLANEOUS) IMPLANT
SIGHTPATH CAT PROC W REG LENS (Ophthalmic Related) ×2 IMPLANT
VISCOELASTIC ADDITIONAL (OPHTHALMIC RELATED) IMPLANT
WATER STERILE IRR 250ML POUR (IV SOLUTION) ×1 IMPLANT

## 2012-04-08 NOTE — Brief Op Note (Signed)
04/08/2012  9:56 AM  PATIENT:  Dwayne Davies  57 y.o. male  PRE-OPERATIVE DIAGNOSIS:  nuclear cataract right eye  POST-OPERATIVE DIAGNOSIS:  nuclear cataract right eye  PROCEDURE:  Procedure(s): CATARACT EXTRACTION PHACO AND INTRAOCULAR LENS PLACEMENT (IOC)  SURGEON:  Surgeon(s): Susa Simmonds, MD  ASSISTANTS: Valda Lamb, CST   ANESTHESIA STAFF: Moshe Salisbury, CRNA - CRNA Laurene Footman, MD - Anesthesiologist  ANESTHESIA:   topical and MAC  REQUESTED LENS POWER: 21.0  LENS IMPLANT INFORMATION:   Alcon [id number]  S/n [id number]  Exp 09/2015  CUMULATIVE DISSIPATED ENERGY:40.75  INDICATIONS:see H&P  OP FINDINGS:total white cataract  COMPLICATIONS:None  DICTATION #: see scanned note  PLAN OF CARE: KPE w IOL OS  PATIENT DISPOSITION:  Short Stay:  Pt to contact LMD for diabetes control

## 2012-04-08 NOTE — Anesthesia Postprocedure Evaluation (Signed)
  Anesthesia Post-op Note  Patient: Dwayne Davies  Procedure(s) Performed: Procedure(s) (LRB) with comments: CATARACT EXTRACTION PHACO AND INTRAOCULAR LENS PLACEMENT (IOC) (Right) - CDE:40.75  Patient Location: PACU  Anesthesia Type:MAC  Level of Consciousness: awake, alert  and oriented  Airway and Oxygen Therapy: Patient Spontanous Breathing  Post-op Pain: none  Post-op Assessment: Post-op Vital signs reviewed, Patient's Cardiovascular Status Stable, Respiratory Function Stable, Patent Airway and No signs of Nausea or vomiting  Post-op Vital Signs: Reviewed and stable  Complications: No apparent anesthesia complications

## 2012-04-08 NOTE — Op Note (Signed)
See scanned op note done today 

## 2012-04-08 NOTE — Anesthesia Preprocedure Evaluation (Signed)
Anesthesia Evaluation  Patient identified by MRN, date of birth, ID band Patient awake    Reviewed: Allergy & Precautions, H&P , NPO status   Airway Mallampati: II TM Distance: >3 FB     Dental  (+) Teeth Intact   Pulmonary shortness of breath and with exertion,  breath sounds clear to auscultation        Cardiovascular hypertension, Pt. on medications Rhythm:Regular Rate:Normal     Neuro/Psych Anxiety    GI/Hepatic GERD-  ,  Endo/Other  diabetes, Poorly Controlled, Type 2  Renal/GU      Musculoskeletal   Abdominal   Peds  Hematology   Anesthesia Other Findings   Reproductive/Obstetrics                           Anesthesia Physical Anesthesia Plan  ASA: III  Anesthesia Plan: MAC   Post-op Pain Management:    Induction: Intravenous  Airway Management Planned: Nasal Cannula  Additional Equipment:   Intra-op Plan:   Post-operative Plan:   Informed Consent: I have reviewed the patients History and Physical, chart, labs and discussed the procedure including the risks, benefits and alternatives for the proposed anesthesia with the patient or authorized representative who has indicated his/her understanding and acceptance.     Plan Discussed with:   Anesthesia Plan Comments:         Anesthesia Quick Evaluation

## 2012-04-08 NOTE — H&P (Signed)
I have reviewed the pre printed H&P, the patient was re-examined, and I have identified no significant interval changes in the patient's medical condition.  There is no change in the plan of care since the history and physical of record. 

## 2012-04-08 NOTE — Transfer of Care (Signed)
Immediate Anesthesia Transfer of Care Note  Patient: Dwayne Davies  Procedure(s) Performed: Procedure(s) (LRB) with comments: CATARACT EXTRACTION PHACO AND INTRAOCULAR LENS PLACEMENT (IOC) (Right) - CDE:40.75  Patient Location: PACU and Short Stay  Anesthesia Type:MAC  Level of Consciousness: awake  Airway & Oxygen Therapy: Patient Spontanous Breathing  Post-op Assessment: Report given to PACU RN  Post vital signs: Reviewed  Complications: No apparent anesthesia complications

## 2012-04-09 ENCOUNTER — Encounter (HOSPITAL_COMMUNITY): Payer: Self-pay | Admitting: Ophthalmology

## 2012-06-26 ENCOUNTER — Ambulatory Visit (HOSPITAL_COMMUNITY)
Admission: RE | Admit: 2012-06-26 | Discharge: 2012-06-26 | Disposition: A | Discharge status: Home / Self Care | Payer: Medicare Other | Source: Ambulatory Visit | Attending: Pulmonary Disease | Admitting: Pulmonary Disease

## 2012-06-26 DIAGNOSIS — IMO0001 Reserved for inherently not codable concepts without codable children: Secondary | ICD-10-CM | POA: Insufficient documentation

## 2012-06-26 DIAGNOSIS — M6281 Muscle weakness (generalized): Secondary | ICD-10-CM | POA: Insufficient documentation

## 2013-08-14 ENCOUNTER — Other Ambulatory Visit (HOSPITAL_COMMUNITY): Payer: Self-pay | Admitting: Pulmonary Disease

## 2013-08-14 ENCOUNTER — Ambulatory Visit (HOSPITAL_COMMUNITY)
Admission: RE | Admit: 2013-08-14 | Discharge: 2013-08-14 | Disposition: A | Discharge status: Home / Self Care | Payer: Medicare Other | Source: Ambulatory Visit | Attending: Pulmonary Disease | Admitting: Pulmonary Disease

## 2013-08-14 DIAGNOSIS — M51379 Other intervertebral disc degeneration, lumbosacral region without mention of lumbar back pain or lower extremity pain: Secondary | ICD-10-CM | POA: Insufficient documentation

## 2013-08-14 DIAGNOSIS — M545 Low back pain, unspecified: Secondary | ICD-10-CM

## 2013-08-14 DIAGNOSIS — M5137 Other intervertebral disc degeneration, lumbosacral region: Secondary | ICD-10-CM | POA: Insufficient documentation

## 2013-09-07 ENCOUNTER — Emergency Department (HOSPITAL_COMMUNITY): Payer: Medicare Other

## 2013-09-07 ENCOUNTER — Encounter (HOSPITAL_COMMUNITY): Payer: Self-pay | Admitting: Emergency Medicine

## 2013-09-07 ENCOUNTER — Emergency Department (HOSPITAL_COMMUNITY)
Admission: EM | Admit: 2013-09-07 | Discharge: 2013-09-07 | Disposition: A | Discharge status: Home / Self Care | Payer: Medicare Other | Attending: Emergency Medicine | Admitting: Emergency Medicine

## 2013-09-07 DIAGNOSIS — IMO0002 Reserved for concepts with insufficient information to code with codable children: Secondary | ICD-10-CM | POA: Insufficient documentation

## 2013-09-07 DIAGNOSIS — J449 Chronic obstructive pulmonary disease, unspecified: Secondary | ICD-10-CM | POA: Insufficient documentation

## 2013-09-07 DIAGNOSIS — G8929 Other chronic pain: Secondary | ICD-10-CM | POA: Insufficient documentation

## 2013-09-07 DIAGNOSIS — Y929 Unspecified place or not applicable: Secondary | ICD-10-CM | POA: Insufficient documentation

## 2013-09-07 DIAGNOSIS — J4489 Other specified chronic obstructive pulmonary disease: Secondary | ICD-10-CM | POA: Insufficient documentation

## 2013-09-07 DIAGNOSIS — Z79899 Other long term (current) drug therapy: Secondary | ICD-10-CM | POA: Insufficient documentation

## 2013-09-07 DIAGNOSIS — K219 Gastro-esophageal reflux disease without esophagitis: Secondary | ICD-10-CM | POA: Insufficient documentation

## 2013-09-07 DIAGNOSIS — Y9389 Activity, other specified: Secondary | ICD-10-CM | POA: Insufficient documentation

## 2013-09-07 DIAGNOSIS — F411 Generalized anxiety disorder: Secondary | ICD-10-CM | POA: Insufficient documentation

## 2013-09-07 DIAGNOSIS — S46909A Unspecified injury of unspecified muscle, fascia and tendon at shoulder and upper arm level, unspecified arm, initial encounter: Secondary | ICD-10-CM | POA: Insufficient documentation

## 2013-09-07 DIAGNOSIS — E119 Type 2 diabetes mellitus without complications: Secondary | ICD-10-CM | POA: Insufficient documentation

## 2013-09-07 DIAGNOSIS — S4980XA Other specified injuries of shoulder and upper arm, unspecified arm, initial encounter: Secondary | ICD-10-CM | POA: Insufficient documentation

## 2013-09-07 DIAGNOSIS — Z9889 Other specified postprocedural states: Secondary | ICD-10-CM | POA: Insufficient documentation

## 2013-09-07 DIAGNOSIS — M25512 Pain in left shoulder: Secondary | ICD-10-CM

## 2013-09-07 HISTORY — DX: Other chronic pain: G89.29

## 2013-09-07 HISTORY — DX: Reserved for inherently not codable concepts without codable children: IMO0001

## 2013-09-07 HISTORY — DX: Chronic obstructive pulmonary disease, unspecified: J44.9

## 2013-09-07 HISTORY — DX: Dorsalgia, unspecified: M54.9

## 2013-09-07 HISTORY — DX: Pain in left shoulder: M25.512

## 2013-09-07 LAB — I-STAT TROPONIN, ED: Troponin i, poc: 0 ng/mL (ref 0.00–0.08)

## 2013-09-07 MED ORDER — HYDROCODONE-ACETAMINOPHEN 5-325 MG PO TABS
1.0000 | ORAL_TABLET | Freq: Once | ORAL | Status: AC
  Administered 2013-09-07: 1 via ORAL
  Filled 2013-09-07: qty 1

## 2013-09-07 MED ORDER — OXYCODONE-ACETAMINOPHEN 5-325 MG PO TABS: ORAL_TABLET | ORAL | Status: DC

## 2013-09-07 MED ORDER — OXYCODONE-ACETAMINOPHEN 5-325 MG PO TABS
1.0000 | ORAL_TABLET | Freq: Once | ORAL | Status: AC
  Administered 2013-09-07: 1 via ORAL
  Filled 2013-09-07: qty 1

## 2013-09-07 NOTE — ED Notes (Signed)
Left shoulder pain starting yesterday.  Denies injury, pain worse with movement.

## 2013-09-07 NOTE — ED Notes (Signed)
Pt alert & oriented x4, stable gait. Patient given discharge instructions, paperwork & prescription(s). Patient  instructed to stop at the registration desk to finish any additional paperwork. Patient verbalized understanding. Pt left department w/ no further questions. 

## 2013-09-07 NOTE — ED Notes (Signed)
Pt informed that we are waiting for results of xray and lab. Pt given call bell/remote to TV and repositioned more comfortably in bed. NAD.

## 2013-09-07 NOTE — Discharge Instructions (Signed)
°Emergency Department Resource Guide °1) Find a Doctor and Pay Out of Pocket °Although you won't have to find out who is covered by your insurance plan, it is a good idea to ask around and get recommendations. You will then need to call the office and see if the doctor you have chosen will accept you as a new patient and what types of options they offer for patients who are self-pay. Some doctors offer discounts or will set up payment plans for their patients who do not have insurance, but you will need to ask so you aren't surprised when you get to your appointment. ° °2) Contact Your Local Health Department °Not all health departments have doctors that can see patients for sick visits, but many do, so it is worth a call to see if yours does. If you don't know where your local health department is, you can check in your phone book. The CDC also has a tool to help you locate your state's health department, and many state websites also have listings of all of their local health departments. ° °3) Find a Walk-in Clinic °If your illness is not likely to be very severe or complicated, you may want to try a walk in clinic. These are popping up all over the country in pharmacies, drugstores, and shopping centers. They're usually staffed by nurse practitioners or physician assistants that have been trained to treat common illnesses and complaints. They're usually fairly quick and inexpensive. However, if you have serious medical issues or chronic medical problems, these are probably not your best option. ° °No Primary Care Doctor: °- Call [facility] at  832-8000 - they can help you locate a primary care doctor that  accepts your insurance, provides certain services, etc. °- Physician Referral Service- 1-800-533-3463 ° °Chronic Pain Problems: °Organization         Address  Phone   Notes  °[facility]  (336) 297-2271 Patients need to be referred by their primary care doctor.  ° °Medication  Assistance: °Organization         Address  Phone   Notes  °[place] [facility] [address] °[place], [place] [address] (336) 641-8030 --Must be a resident of [place] °-- Must have NO insurance coverage whatsoever (no Medicaid/ Medicare, etc.) °-- The pt. MUST have a primary care doctor that directs their care regularly and follows them in the community °  °[facility]  (866) 331-1348   °[employer]  (888) 892-1162   ° °Agencies that provide inexpensive medical care: °Organization         Address  Phone   Notes  °[facility]  (336) 832-8035   °[facility]    (336) 832-7272   °[facility] [address] °[place], [place] [address] (336) 832-4777   °[facility] [address], °[place] (336) 271-4999   °[facility]    (336) 373-0678   °[facility]    (336) 272-1050   °[facility] ° [address], [place] Phone:  (336) 832-4444, Fax:  (336) 832-4440 Hours of Operation:  9 am - 6 pm, M-F.  Also accepts Medicaid/Medicare and self-pay.  °[facility] ° [address], [place] Phone: (336) 832-3150, Fax: (336) 832-3151. Hours of Operation:  8:30 am - 5:30 pm, M-F.  Also accepts Medicaid and self-pay.  °[facility] [place] [address], [place] Phone: (336) 878-6027   °[facility] [address], [place], [place] (336)723-1848, Ext. 123 Mondays & Thursdays: 7-9 AM.  First 15 patients are seen on a first come, first serve basis. °  ° °Medicaid-accepting [place] Providers: ° °Organization         Address  Phone   Notes  °[facility] [address], [place] (336) 641-2100 Also accepts self-pay patients.  °[facility] [address], [place] ° (336) 856-9996   °[facility] [address], [place]  (336) 288-8857   °[facility] [address] [place] [address], [place] (336) 299-7000   °Veita Bland [address], [place]  ° (336) 373-1557 Only accepts [place] Access Medicaid patients after they have their name applied to their card.  ° °Self-Pay (no insurance) in [place]: ° °Organization         Address  Phone   Notes  °Sickle Cell Patients, [facility] [address], [place] (336) 832-1970   °[facility] [address], [place] (336) 832-4400   °[facility] ° [address], [place] (336) 992-4800   °[facility]/Dr. Osei-Bonsu ° [address] [place] [address], [place] or [address], [place] (336) 841-8500 Phone number for both [place] and [place] locations is the same.  °[facility] [address], [place] (336) 299-0000   °[facility] [address] [place] [address], [place] or [address] (336) 852-7530 °(336) 878-2260   °[facility] [address], [place] (336) 350-1642, phone; (336) 294-5005, fax Sees patients 1st and 3rd Saturday of every month.  Must not qualify for public or private insurance (i.e. Medicaid, Medicare, [place] Health Choice, Veterans' Benefits) • Household income should be no more than 200% of the poverty level •The clinic cannot treat you if you are pregnant or think you are pregnant • Sexually transmitted diseases are not treated at the clinic.  ° ° °Dental Care: °Organization         Address  Phone  Notes  °[place] [facility] [address], [place] (336) 641-6152 Accepts children up to age 21 who are enrolled in Medicaid or [place] Health Choice; pregnant women with a Medicaid card; and children who have applied for Medicaid or [place] Health Choice, but were declined, whose parents can pay a reduced fee at time of service.  °[place]  [facility] [place]  [address], [place] (336) 641-7733 Accepts children up to age 21 who are enrolled in Medicaid or [place] Health Choice; pregnant women with a Medicaid card; and children who have applied for Medicaid or [place] Health Choice, but were declined, whose parents can pay a reduced fee at time of service.  °[facility] ° [address], [place] (336) 641-4533 Patients are seen by appointment only. Walk-ins are not accepted. [facility] will see patients 18 years of age and older. °Monday - Tuesday (8am-5pm) °Most Wednesdays (8:30-5pm) °$30 per visit, cash only  °[facility] ° [address], [place] (336) 641-4533 Patients are seen by appointment only. Walk-ins are not accepted. [facility] will see patients 18 years of age and older. °One   Wednesday Evening (Monthly: Volunteer Based).  $30 per visit, cash only  °[facility]  (919) 537-3737 for adults; Children under age 4, call Graduate Pediatric Dentistry at (919) [phone]. Children aged 4-14, please call (919) [phone] to request a pediatric application. ° Dental services are provided in all areas of dental care including fillings, crowns and bridges, complete and partial dentures, implants, gum treatment, root canals, and extractions. Preventive care is also provided. Treatment is provided to both adults and children. °Patients are selected via a lottery and there is often a waiting list. °  °[facility] [address], °[place] ° (336) 763-8833 [email or web address].[email or web address] °  °[facility] [address], [place], [place] (336)723-1848, Ext. 123 Second and Fourth Thursday of each month, opens at 6:30 AM; Clinic ends at 9 AM.  Patients are seen on a first-come first-served basis, and a limited number are seen during each clinic.  ° °[facility] ° [address], [place], [place] (336) 723-7904    Eligibility Requirements °You must have lived in [place], or [place] counties for at least the last three months. °  You cannot be eligible for state or federal sponsored healthcare insurance, including Veterans Administration, Medicaid, or Medicare. °  You generally cannot be eligible for healthcare insurance through your employer.  °  How to apply: °Eligibility screenings are held every Tuesday and Wednesday afternoon from 1:00 pm until 4:00 pm. You do not need an appointment for the interview!  °[facility] [address], [place], [place] 336-631-2330   °[facility]  336-342-8273   °[facility]  336-703-3100   °[facility]  336-570-6415   ° °Behavioral Health Resources in the Community: °Intensive Outpatient Programs °Organization         Address  Phone  Notes  °[facility] [address], [place], [place] 336-878-6098   °[facility] [address], [place], [place] 336-832-9800   °[facility] [address], [place], [place] ° 336-882-2125   °[facility] [address],  °[place], [place] 1-800-853-5163 or 336-641-4981   °Substance Abuse Resources °Organization         Address  Phone  Notes  °[facility]  336-882-2125   °[facility]  336-784-9470   °[facility]  336-285-9073   °[facility]  336-845-3988   °Residential & Outpatient Substance Abuse Program  1-800-659-3381   °Psychological Services °Organization         Address  Phone  Notes  °[facility]  336- 832-9600   °[facility]  336- 378-7881   °[facility] [address], [place] 1-800-853-5163 or 336-641-4981   ° °Mobile Crisis Teams °Organization         Address  Phone  Notes  °[facility], Mobile Crisis Care Unit  1-877-626-1772   °Assertive °[facility] ° [address]  [place], [place] 336-834-9664   °Sharon DeEsch [address] °[place] 336-554-5454   ° °Self-Help/Support Groups °Organization         Address  Phone             Notes  °[facility]  - variety of support groups  336- 373-1402 Call for more information  °Narcotics Anonymous (NA), [facility] [address], °[place]  2 meetings at this location  ° °  Residential Treatment Programs Organization         Address  Phone  Notes  [facility] [address]    [place]  2-956-213-0865   [facility]  [address] [place] [address], [place], [place] 295-284-1324   [facility] [address] [place], [place] 401-027-2536 Admissions: 8am-3pm M-F  [facility] [address]    [place], [place] 644-034-7425   [facility] [address] [place], [place], [place] 956-387-5643   [facility] [address]  [place], [place] 329-518-8416   [facility] Intensive Outpatient [address], [place], [place] 606-301-6010   [facility] ([facility] [address]  [place], [place] 9-323-557-3220 or 270-766-4487   [facility] ([facility] [address], [place], [place] 628-315-1761 Accepts Medicaid  [facility] [address],  [place] 6-073-710-6269 Substance Abuse/Addiction Treatment   [facility]         Address  Phone  Notes  [facility]  (508)676-4044   Angie Fava, PhD [address] [place], [place]   732-572-0848 or (207)548-5832   [facility]   [address] [place], [place] (437) 008-0854   [facility] [address] [place], [place] (450) 158-2170 Insurance/Medicaid/sponsorship through [facility] Families [address]                                    [place], [place] [phone] Therapy/tele-psych/case    [facility] [address], [place] 386-194-2646    Dr. Lolly Mustache  [phone]   [facility]  [employer] [facility] 1) [address] [place] 2) [address], [place] 3)  [address], [place] [phone]  (712) [phone]   [facility] Child Abuse Hotline 3208710752 or 226-537-1803 (After Hours)       Take the prescription as directed.  Apply moist heat or ice to the area(s) of discomfort, for 15 minutes at a time, several times per day for the next few days.  Do not fall asleep on a heating or ice pack. Wear the sling for comfort for the next few days, then remove and slowly resume your usual activities. Call the Orthopedic doctor on Monday to schedule a follow up appointment this week. Call your regular medical doctor on Monday to schedule a follow up appointment in the next 3 days.  Return to the Emergency Department immediately if worsening.

## 2013-09-07 NOTE — ED Provider Notes (Signed)
CSN: [id number]     Arrival date & time 09/07/13  [id number] History   First MD Initiated Contact with Patient 09/07/13 [phone]     Chief Complaint  Patient presents with  . Shoulder Pain     HPI Pt was seen at 0900. Per pt, c/o gradual onset and persistence of constant left shoulder "pain" for the past 2 days. Pt states he was pulling objects into the back of a tractor yesterday approximately 1000/1030, his "left hand slipped" and he hit his anterior left shoulder against the tire of the tractor. Describes the pain as "aching," worsens with palpation of the area and movement of his left arm. Pt also c/o intermittent ongoing left thumb, index, and middle palmar fingers "tingling" for the past several years; denies any change in this symptom.  Denies CP/palpitations, no SOB/cough, no neck or back pain, no abd pain, no N/V/D, no focal motor weakness.    Past Medical History  Diagnosis Date  . Diabetes mellitus   . GERD (gastroesophageal reflux disease)   . Anxiety   . Chest pain   . Elevated lipids   . Shortness of breath   . Gout   . Chronic back pain   . COPD (chronic obstructive pulmonary disease)   . Chronic pain in left shoulder   . Normal cardiac stress test 2000, 2005   Past Surgical History  Procedure Laterality Date  . Cataract extraction w/phaco  04/08/2012    Procedure: CATARACT EXTRACTION PHACO AND INTRAOCULAR LENS PLACEMENT (IOC);  Surgeon: Susa Simmondsarroll F Haines, MD;  Location: [facility];  Service: Ophthalmology;  Laterality: Right;  CDE:40.75  . Cardiac catheterization      x2 (last 2001), both normal coronary arteries   Family History  Problem Relation Age of Onset  . Coronary artery disease Neg Hx    History  Substance Use Topics  . Smoking status: Never Smoker   . Smokeless tobacco: Not on file  . Alcohol Use: No    Review of Systems ROS: Statement: All systems negative except as marked or noted in the HPI; Constitutional: Negative for fever and chills. ; ; Eyes: Negative for  eye pain, redness and discharge. ; ; ENMT: Negative for ear pain, hoarseness, nasal congestion, sinus pressure and sore throat. ; ; Cardiovascular: Negative for chest pain, palpitations, diaphoresis, dyspnea and peripheral edema. ; ; Respiratory: Negative for cough, wheezing and stridor. ; ; Gastrointestinal: Negative for nausea, vomiting, diarrhea, abdominal pain, blood in stool, hematemesis, jaundice and rectal bleeding. . ; ; Genitourinary: Negative for dysuria, flank pain and hematuria. ; ; Musculoskeletal: Negative for back pain and neck pain. Negative for swelling. +left shoulder pain.; ; Skin: Negative for pruritus, rash, abrasions, blisters, bruising and skin lesion.; ; Neuro: Negative for headache, lightheadedness and neck stiffness. Negative for weakness, altered level of consciousness , altered mental status, extremity weakness, involuntary movement, seizure and syncope.      Allergies  Review of patient's allergies indicates no known allergies.  Home Medications   Prior to Admission medications   Medication Sig Start Date End Date Taking? Authorizing Provider  albuterol (PROVENTIL HFA;VENTOLIN HFA) 108 (90 BASE) MCG/ACT inhaler Inhale 2 puffs into the lungs every 6 (six) hours as needed. Shortness of Breath    Historical Provider, MD  ALPRAZolam (XANAX) 0.25 MG tablet Take 0.25 mg by mouth daily as needed. Anxiety    Historical Provider, MD  glyBURIDE-metformin (GLUCOVANCE) 5-500 MG per tablet Take 1 tablet by mouth 2 (two) times daily.  Historical Provider, MD  guaiFENesin (MUCINEX) 600 MG 12 hr tablet Take 600 mg by mouth 2 (two) times daily.    Historical Provider, MD  omeprazole (PRILOSEC) 20 MG capsule Take 20 mg by mouth daily.      Historical Provider, MD   BP 152/98  Pulse 110  Temp(Src) 98.6 F (37 C) (Oral)  Resp 20  Ht 5\' 9"  (1.753 m)  Wt 250 lb (113.399 kg)  BMI 36.90 kg/m2  SpO2 95% Physical Exam 0905: Physical examination:  Nursing notes reviewed; Vital signs  and O2 SAT reviewed;  Constitutional: Well developed, Well nourished, Well hydrated, In no acute distress; Head:  Normocephalic, atraumatic; Eyes: EOMI, PERRL, No scleral icterus; ENMT: Mouth and pharynx normal, Mucous membranes moist; Neck: Supple, Full range of motion, No lymphadenopathy; Cardiovascular: Regular rate and rhythm, No gallop; Respiratory: Breath sounds clear & equal bilaterally, No rales, rhonchi, wheezes.  Speaking full sentences with ease, Normal respiratory effort/excursion; Chest: Nontender, No deformity. Movement normal; Abdomen: Soft, Nontender, Nondistended, Normal bowel sounds; Genitourinary: No CVA tenderness; Spine:  No midline CS, TS, LS tenderness.;; Extremities: Pulses normal, Left shoulder w/FROM. +TTP left AC joint. Left clavicle NT, scapula NT, proximal humerus NT, biceps tendon NT over bicipital groove.  Motor strength at shoulder normal.  Sensation intact over deltoid region, distal NMS intact with left hand having intact and equal sensation and strength in the distribution of the median, radial, and ulnar nerve function compared to opposite side.  Strong radial pulse.  +FROM left elbow with intact motor strength biceps and triceps muscles to resistance. No deformity, no edema, no ecchymosis, no erythema. NT left elbow/wrist/hand. +Tenel's at left wrist.; Neuro: AA&Ox3, Major CN grossly intact.  Speech clear. Grips equal. Strength 5/5 equal bilat UE's and LE's. No gross focal motor or sensory deficits in extremities. Climbs on and off stretcher easily by himself. Gait steady.; Skin: Color normal, Warm, Dry.   ED Course  Procedures     EKG Interpretation   Date/Time:  Sunday September 07 2013 08:56:59 EDT Ventricular Rate:  107 PR Interval:  166 QRS Duration: 87 QT Interval:  322 QTC Calculation: 430 R Axis:   22 Text Interpretation:  Sinus tachycardia Borderline T abnormalities,  inferior leads When compared with ECG of 04/02/2012 No significant change  was found  Confirmed by [facility]  MD, Nicholos Johns [phone]) on 09/07/2013 9:00:52  AM      MDM  MDM Reviewed: previous chart, nursing note and vitals Reviewed previous: ECG and labs Interpretation: ECG, labs and x-ray   Results for orders placed during the hospital encounter of 09/07/13  I-STAT TROPOININ, ED      Result Value Ref Range   Troponin i, poc 0.00  0.00 - 0.08 ng/mL   Comment 3            Dg Shoulder Left 09/07/2013   CLINICAL DATA:  Pain post injury.  EXAM: LEFT SHOULDER - 2+ VIEW  COMPARISON:  Chest x-ray 03/08/2010  FINDINGS: Mild degenerative change of the [facility] joint. Glenohumeral joint is unremarkable. There is no fracture dislocation.  IMPRESSION: No acute fracture.   Electronically Signed   By: Elberta Fortis M.D.   On: 09/07/2013 09:46    1125:  Doubt PE as cause for symptoms with low risk [place].  Doubt ACS as cause for symptoms with normal troponin and unchanged EKG from previous after near 24 hours of constant symptoms, as well as pt's hx of reassuring cardiac stress test and cardiac cath x2. Will  tx symptomatically at this time; refer to Ortho MD. Pt wants to go home now. Dx and testing d/w pt and family.  Questions answered.  Verb understanding, agreeable to d/c home with outpt f/u.    Laray Anger, DO 09/10/13 [phone]

## 2014-07-08 ENCOUNTER — Ambulatory Visit (HOSPITAL_COMMUNITY): Payer: Medicare Other | Attending: Pulmonary Disease | Admitting: Physical Therapy

## 2014-07-08 DIAGNOSIS — G7100 Muscular dystrophy, unspecified: Secondary | ICD-10-CM

## 2014-07-08 NOTE — Therapy (Addendum)
[facility] [address] [place], [place], [address] Phone: [phone]   Fax:  [phone]  Patient Details  Name: Dwayne Davies MRN: [id number] Date of Birth: [birth year] Referring Provider:  Kari Baars, MD  Encounter Date: 07/08/2014   Physical Work Performance EvaluationTM [facility] [address], [place]  [address] Phone [phone]  Fax [phone] Please note that significant self-limiting and inconsistent behavior heavily influenced test results. 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  This evaluation is substantiated by reliability and validity research conducted at the [place] [facility] [place] [facility] [place] and reported in the Journal of Occupational Medicine, September 199411  Danley Danker, et al. Journal of Occupational Medicine. September 1994 Volume 36, No. 9: pages [phone].   . Minimal Overall Level of Work: Falls within the [employer] range.  Exerting up to 20 pounds of force occasionally, and/or up to 10 pounds of force frequently, and/or a negligible amount of force constantly (Constantly: activity or condition exist 2/3 or more of the time) to move objects.  Physical demand requirements are in excess of those for Sedentary Work.  Even though the weight lifted may be only a negligible amount, a job should be rated Light Work: (1) when it requires walking or standing to significant degree; or (2) when it requires sitting most of the time but entails pushing and/or pulling of arm or leg controls; and/or (3) when the job requires working at a production rate pace entailing the constant  pushing and/or pulling of materials even though the weight of those materials is negligible.  NOTE: The constant stress and strain of maintaining a production rate pace, especially in an industrial setting, can be and is physically demanding of a worker even though the amount of force exerted is negligible.  Please note that the overall level of work was significantly influenced by the client's self-limiting and inconsistent behavior.  Therefore, the Light level of work indicates a minimum ability rather than a maximum ability.  A maximum overall level of work cannot be determined at this time due to the self-limiting and inconsistent behavior.  Please see the Task Performance Table for specific abilities.   Tolerance for the 8-Hour Day: Based on the individual task scores in Dynamic Strength, Position Tolerance and Mobility, the client is able to tolerate the Light level of work for the 8-hour day/40-hour week.  Please note that the tolerance for the 8-hour day was significantly influenced by the client's self-limiting and inconsistent behavior and indicates his minimal rather than his maximal ability.    Self Limiting Behavior: The client self-limited on 52% of the 21 tasks.   Performance on the self-limiting tasks indicates a minimum rather than a maximum ability.  A maximum on these tasks could not be determined due to the self-limiting behavior.   Self-limiting behavior means that the client stopped the task before a maximum effort was reached.  Possible causes of self-limiting behavior include: (1) pain; (2) psychosocial issues such as fear of reinjury, anxiety, or depression; and/or (3) attempts to manipulate test results.  Although it is difficult to determine the causes of self-limiting behavior, our research indicates that motivated clients  self-limit on no more than 20% of test items.  If the self-limiting exceeds 20%, then psychosocial and/or motivational factors are affecting test results.     . Self Limiting < 20% of tasks = Within normal limits1 . Self-Limiting 21% to 33% of tasks = Exceeds normal limits1 . Self-Limiting > 33% of tasks = Significantly exceeds normal limits1 1When compared to a motivated group of patients who participated in research.    ADDITIONAL CLIENT STATEMENTS ABOUT SELF-LIMITING BEHAVIOR  The client's stated reasons for self-limiting behavior are listed beside each self-limited task in the Task Performance Summary section of this report.  In addition, the client made the following statements about self-limiting behavior during the evaluation: . fingers hurting . Pain in hand tired being on his feet . in wrists . back . Pt felt as though his legs were going to cramp.l   RESULTS OF FORMAL CONSISTENCY OF EFFORT TESTING  . The [employer] FCE utilizes a formal consistency of effort protocol established and validated by Stokes et al.2  In this protocol, three (3) different statistical calculations on grip strength testing data are performed.  These results are then combined with any evidence of clinical inconsistencies or self-limiting behavior observed during the [employer] FCE.  The final consistency of effort conclusion indicates the strength of all of this evidence combined.  . Combining the results of the clinical consistency comparisons, the presence of self-limiting behavior and the three formal consistency cross comparisons of the grip strength data, indicates that there is significant evidence of low effort and inconsistent behavior.  See addendum for details of the formal consistency of effort scoring criteria.   SUBJECTIVE PAIN STATEMENTS  The client did not make any subjective pain statements during the test.  PAIN BEHAVIORS AND THEIR IMPACT ON TEST RESULTS  The client did not demonstrate any pain behaviors during the test.  OTHER EXTERNAL FACTORS THAT MIGHT IMPACT TEST RESULTS  No external factors noted that might impact test results.   BODY  MECHANICS AND MOVEMENT PATTERNS  The client demonstrated safe body mechanics and movement patterns during the test.  BRIEF SUMMARY OF MEDICAL HISTORY  Mr. Glockner states that he had worked for the [employer] for 20 years.  In 2008 he noticed that his hands and legs would not work like he wanted them to and he was unable to complete his job duties therefore he came out on disability.  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  He currently has been referred for an FCE for muscular Dystrophy.  MEDICATIONS   Medication Dose Frequency Last Dose Taken  Albutereol     xanax     metformin     mucinex        BRIEF MUSCULOSKELETAL SCREEN  . UE- AROM is wfl.   Pt has decreased strength B Rt generally 3+/5; Lt generally 4/5  .  Marland Kitchen LE- AROM is wfl.  Pt hip mm strength decreased B generally 3/5; Knee and ankle wfl TEST LENGTH AND REST BREAKS  The test lasted 4:56 hours.  Short pauses of 2-3 minutes between tasks occur while the evaluator is setting up equipment and documenting scores.  In addition to these naturally occurring rest breaks, 7 additional rest breaks were taken for a break total of approximately 4 minutes.  3 of these breaks were initiated by the client and the evaluator observed physiological signs of fatigue as well.  4 of these breaks were initiated by the client but no signs of fatigue were  observed.   TASK PERFORMANCE  Please note that the results noted below in red italics and marked SL are tasks on which the client self-limited.  On these self-limiting tasks, the performance represents a minimal rather than a maximal performance.  On these tasks we cannot determine a maximum performance. Tasks Client Performance 1 Self-Limiting Reasons Maximum Abilities 1  Floor to waist lift 0 lb Occas.  0 lb Occas.  Waist to eye level lift 25 lb Occas.-SL Fear Reinjury ?  Two handed carrying 15 lb Occas.  15 lb Occas.  One handed carrying L17 lb Occ-SL Pain ?  Pushing 20 lb Occas.-SL 3  Pain ? 3  Pulling 18 lb Occas. 3  18 lb Occas. 3  Sitting Constantly   Constantly  Standing Occasionally-SL  Pain ?  Work arms over head-standing Frequently-SL Pain ?  Work bent over-standing/stooping Occasionally-SL Fatigue ?  Work kneeling Occasionally-SL Pain ?  Work bent over-sitting Occasionally-SL Pain ?  Work squatting/crouching Never  Never  Work arms over head-supine Never  Never  Research officer, political party Other ?  Repetitive squatting Never  Never  Walking Frequently   Frequently  Crawling Occasionally-SL Pain ?  Climbing a ladder Never   Never  Repetitive trunk rotation-sitting Occasionally-SL Pain ?  Repetitive trunk rotation-standing Never  Never  Balance on level surfaces Adequate  Adequate  Balance on uneven surfaces Inadequate  Inadequate  Balance on ladder Inadequate  Inadequate  Grip Strength L38  R14 lb  L38  R14 lb   1 Occasionally = up to 1/3 of the day, Frequently = 1/3 to 2/3 of the day, Constantly = 2/3 to the full day.  Frequent lifting = 50% of Occasional; Constant lifting = 20%  of Occasional. 2 D.O.T. The aptitudes:  1 (90-100 percentile), 2 (67-89 percentile), 3 (34-66 percentile), 4 (11-33 percentile), 5 (0-10 percentile). 3 Pounds of force is the amount of force the client exerted during the pushing and pulling tasks.  If pushing or pulling is required for work, the force required for the task should be measured with a force gauge for comparison.        COORDINATION   Tasks Assessment   30. Finger to Nose Needs Further Evaluation   31. Response Speed N.T.   32. Alternate Hand Slapping N.T.   33. Hand/Eye/Foot Coordination Needs Further Evaluation   MAJOR AREAS OF DYSFUNCTION  . Dynamic Strength . Position Tolerance  FACTORS UNDERLYING PERFORMANCE  . Decreased muscle strength in hands and wrists . Generalized de-conditioning . Decreased muscle flexibility in back . Pain in hands, wrists, shoulders and ankles . Generalized  fatigue  EXIT INTERVIEW  . There were no changes in musculoskeletal status from beginning to end. . Pain Score: 0 . Gait pattern leaving the evaluation is  compared to gait pattern used upon arriving for test. . The client drove himself to the evaluation.     Evaluator: Bella Kennedy PT Phone: [phone]    Consistency of Effort Testing and Conclusion The [employer] FCE utilizes a formal consistency of effort protocol established and validated by Stokes et al.2   In this protocol, three (3) different statistical calculations on grip strength testing data are performed.  These results are then combined with any evidence of clinical inconsistencies or self-limiting behavior observed during the [employer] FCE.  The final consistency of effort conclusion indicates the strength of all of this evidence combined.   Statistical Test 1.  Right Hand The standard deviation of sustained maximum  right grip strength across 5 handle positions was 2.14, indicating a relatively flat bell shaped curve and low effort on the bell shaped curve test for the right hand.2   Left Hand  The standard deviation of sustained maximum left grip strength across 5 handle positions was 6.52, indicating a relatively flat bell shaped curve and low effort on the bell shaped curve test for the left hand.2     Statistical Test 2.   The Rapid Exchange Grip (REG) is 5 pounds different from the peak slow sustained grip.  Clinical studies demonstrate2 that this difference is not significant and indicates that the client exerted maximum effort.    Statistical Test 3.   A regression analysis was calculated based on the peak effort of sustained grip and the maximum REG.  This calculation indicates that the patient gave a maximum effort on grip strength tests.2    Self-Limiting Behavior Self-Limiting behavior was > 20%.  Clinical Inconsistencies No additional significant clinical inconsistencies were noted during the  FCE.  Conclusion Regarding Consistency of Effort.  Combining the results of the clinical consistency comparisons, the presence of self-limiting behavior and the three formal consistency cross comparisons of the grip strength data, indicates that there is significant evidence of low effort and inconsistent behavior.2   2Stokes, HM et al.  Identification of Low-effort Patients Through Dynamometry.  Journal of Hand Surgery. Vol 20A, No 6, November, 1995, pp. 1047 - 1055.      DATA DETAILS   Note: For each task, client participation is rated as:  Appropriate - Client and therapist agree on stopping task. Full, physical effort given. Overextending - Therapist stops task. Client willing to continue despite maximum being reached. Full, physical effort given. Self-limiting - Client stops task before objective signs indicate that a maximum physical effort has been reached.   Lift - Floor to Waist - Appropriate . Pain Score = 6.00 . Pain Location = shoulder . Ending HR = 87 Signs of Effort . Hands Slip/Difficulty Holding Box . Decreased Box Control . dropped box  Climbing Stairs - Self-Limiting . Completed 56 of 100 - 56% of task . Pain Score = 6.00 . Pain Location = hands and legs . Ending HR = 100 . Minimal Deviations  o Decreased Hip Flexion  o decreased eccentric control comig down steps;  Repetitive Squatting - Appropriate . Completed 6 of 25 - 24% of task . Pain Score = 6.00 . Pain Location = wrist . Ending HR = 93 . Severe Deviations  o Unable to Assume Full Squat  Lift - Waist Height to Eye Level - Self-Limiting . Completed 25 lb safely . Pain Score = 7.00 . Pain Location = Rt shoulder . Ending HR = 95 Signs of Effort . Post Trunk Lean . Upper Trap Elevation . Box Resting Against Chest  Bilateral Carry - Appropriate . Completed 15 lb safely . Pain Score = 8.00 . Pain Location = Rt shoulder/elbow and wrist . Ending HR = 90 Signs of Effort . Post Trunk  Lean . Hands Slip/Difficulty Holding Box . Irregular Gait . Increased Time to Complete Repetitions  Unilateral Carry - Self-Limiting . Left completed 17 lb safely . Pain Score = 8.00 . Pain Location = wrist . Ending HR = 92 Signs of Effort . Lateral Trunk Lean . Irregular Gait  Maximum Dynamic Pushing - Self-Limiting . Completed 20 lb safely . Pain Score = 8.00 . Pain Location = [place] and wrist Rt side . Ending HR =  97 Signs of Effort . Forward Lean Increases . Increased Time to Complete Repetitions . Uses Chest Against Sled  Maximum Dynamic Pulling - Appropriate . Completed 18 lb safely . Pain Score = 9.00 . Pain Location = shoulder and arm R . Ending HR = 96 Signs of Effort . Decreased Heel Contact . Increased Time to Complete Repetitions  Sitting Tolerance - Appropriate . Completed 5:00 of 5:00 minutes - 100% of task . Pain Score   1 Min = 3 . Pain Location . Within Normal Limits  Standing Tolerance - Self-Limiting . Completed 2:29 of 5:00 minutes - 50% of task . Pain Score   1 Min = 7 . Pain Location   1 Min = Rt ankle pain . Minimal Deviations  o Decreased Weight Bearing on One Leg  Work Arms Overhead & Standing - Self-Limiting . Completed 1:27 of 5:00 minutes - 29% of task . Pain Score   1 Min = 5 . Pain Location   1 Min = Rt wrist . Minimal Deviations  o Decreased Weight Bearing on One Leg  Work [employer] Over / Stooping - Self-Limiting . Completed 2:34 of 5:00 minutes - 51% of task . Position Adjustments = 4 . Pain Score   1 Min = 6 . Pain Location   1 Min = Back . Moderate Deviations  o Decreased Weight Bearing on One Leg  o Lateral Trunk Shift (Right or Left)  Kneeling - Self-Limiting . Completed 1:52 of 5:00 minutes - 37% of task . Position Adjustments = 2 . Pain Score   1 Min = 8 . Pain Location   1 Min = Hands . Minimal Deviations  o Decreased Weight Bearing on One Leg  Work Bent Over Sitting - Self-Limiting . Completed 1:02 of 5:00  minutes - 21% of task . Pain Score   1 Min = 7 . Pain Location   1 Min = Back . Moderate Deviations  o Supporting Body Weight through Legs  o Decreased Lumbar Flexion  o Inability to Assume Normal Position Due to Obesity  Squatting - Appropriate . Completed 2:12 of 5:00 minutes - 44% of task . Position Adjustments = 3 . Pain Score   1 Min = 6 . Pain Location   1 Min = Rt leg . Severe Deviations  o Supporting Body Weight through "Up" Leg  o Decreased Lumbar Flexion  o Difficulty Balancing  Work Arms Overhead Supine - Appropriate . Completed 0:04 of 5:00 minutes - 1% of task . Position Adjustments = 3 . Pain Score   1 Min = 10 . Pain Location   1 Min = Upper thoracic . Severe Deviations  o Inablilty to [employer] - Appropriate . Completed 500 of 500 - 100% of task . Pain Score = 5.00 . Pain Location = Rt ankle pain . Ending HR = 90 . Minimal Deviations  o Decreased Ankle Plantar Flexion  Crawling - Self-Limiting . Completed 15 of 50 - 30% of task . Pain Score = 7.00 . Pain Location = knees . Ending HR = 91 . Moderate Deviations  o Decreased Weight Bearing on Palms  o Decreased Weight Bearing through Upper Extremities  Climbing a Ladder - Appropriate . Completed 4 of 40 - 10% of task . Pain Score = 8.00 . Pain Location = legs . Ending HR = 90 . Severe Deviations  o Takes one Step at a Time Rather than One Foot over the Other  o Rt knee  gave away  Repetitive Trunk Rotation - Sitting - Self-Limiting . Completed 13 of 25 - 52% of task . Pain Score = 8.00 . Pain Location = Rt wrist . Ending HR = 91 . Minimal Deviations  o Excessive Lateral Tilting of Trunk to Sub for Trunk Rotation/Shoulder Flexion  Repetitive Trunk Rotation - Standing - Appropriate . Completed 10 of 25 - 40% of task . Pain Score = 0.00 . Pain Location = . Severe Deviations  o Decreased Trunk Rotation  o Decreased Weight Shift  o droped box with right arm  Isometric Grip Strength -  Appropriate . Pain Score = 8 . Pain Location = hand and wrist . Percentile Score  Left = 0%   Right = 0% . Grip Avg.  Left = 38 lb   Right = 14 lb . Moderate Deviations   Virgina OrganCynthia Flavia Bruss, [place] CLT [phone] 07/08/2014, 2:06 PM  [facility] [address] [place], [place], [id number] Phone: [phone]   Fax:  [phone]

## 2014-10-08 LAB — HEMOGLOBIN A1C: HEMOGLOBIN A1C: 9.6 % — AB (ref 4.0–6.0)

## 2015-03-10 ENCOUNTER — Encounter: Payer: Self-pay | Admitting: "Endocrinology

## 2015-03-10 ENCOUNTER — Ambulatory Visit (INDEPENDENT_AMBULATORY_CARE_PROVIDER_SITE_OTHER): Payer: Medicare Other | Admitting: "Endocrinology

## 2015-03-10 VITALS — BP 146/77 | HR 101 | Ht 68.0 in | Wt 250.0 lb

## 2015-03-10 DIAGNOSIS — E1165 Type 2 diabetes mellitus with hyperglycemia: Secondary | ICD-10-CM

## 2015-03-10 DIAGNOSIS — IMO0002 Reserved for concepts with insufficient information to code with codable children: Secondary | ICD-10-CM

## 2015-03-10 DIAGNOSIS — E118 Type 2 diabetes mellitus with unspecified complications: Secondary | ICD-10-CM | POA: Diagnosis not present

## 2015-03-10 MED ORDER — ACCU-CHEK AVIVA DEVI: Status: AC

## 2015-03-10 MED ORDER — METFORMIN HCL 1000 MG PO TABS: 1000.0000 mg | ORAL_TABLET | Freq: Two times a day (BID) | ORAL | Status: DC

## 2015-03-10 MED ORDER — GLUCOSE BLOOD VI STRP: ORAL_STRIP | Status: AC

## 2015-03-10 MED ORDER — CANAGLIFLOZIN 100 MG PO TABS: 100.0000 mg | ORAL_TABLET | Freq: Every day | ORAL | Status: DC

## 2015-03-10 NOTE — Progress Notes (Signed)
Subjective:    Patient ID: Dwayne Davies, male    DOB: 11/06/1955. Patient is being seen in consultation for management of diabetes requested by  Fredirick Maudlin, MD  Past Medical History  Diagnosis Date  . Diabetes mellitus   . GERD (gastroesophageal reflux disease)   . Anxiety   . Chest pain   . Elevated lipids   . Shortness of breath   . Gout   . Chronic back pain   . COPD (chronic obstructive pulmonary disease) (HCC)   . Chronic pain in left shoulder   . Normal cardiac stress test 2000, 2005   Past Surgical History  Procedure Laterality Date  . Cataract extraction w/phaco  04/08/2012    Procedure: CATARACT EXTRACTION PHACO AND INTRAOCULAR LENS PLACEMENT (IOC);  Surgeon: Susa Simmonds, MD;  Location: [facility];  Service: Ophthalmology;  Laterality: Right;  CDE:40.75  . Cardiac catheterization      x2 (last 2001), both normal coronary arteries   Social History   Social History  . Marital Status: Widowed    Spouse Name: N/A  . Number of Children: N/A  . Years of Education: N/A   Social History Main Topics  . Smoking status: Never Smoker   . Smokeless tobacco: None  . Alcohol Use: No  . Drug Use: No  . Sexual Activity: Yes   Other Topics Concern  . None   Social History Narrative   Lives with only son   No regular drug use   Outpatient Encounter Prescriptions as of 03/10/2015  Medication Sig  . colchicine 0.6 MG tablet Take 0.6 mg by mouth daily.  . furosemide (LASIX) 40 MG tablet Take 40 mg by mouth.  . meclizine (ANTIVERT) 25 MG tablet Take 25 mg by mouth 3 (three) times daily as needed for dizziness.  . tamsulosin (FLOMAX) 0.4 MG CAPS capsule Take 0.4 mg by mouth.  Marland Kitchen albuterol (PROVENTIL HFA;VENTOLIN HFA) 108 (90 BASE) MCG/ACT inhaler Inhale 2 puffs into the lungs every 6 (six) hours as needed. Shortness of Breath  . Blood Glucose Monitoring Suppl (ACCU-CHEK AVIVA) device Use as instructed  . canagliflozin (INVOKANA) 100 MG TABS tablet Take 1 tablet (100  mg total) by mouth daily before breakfast.  . glucose blood (ACCU-CHEK AVIVA) test strip Use to test glucose 4 times a day.  . metFORMIN (GLUCOPHAGE) 1000 MG tablet Take 1 tablet (1,000 mg total) by mouth 2 (two) times daily with a meal.  . omeprazole (PRILOSEC) 20 MG capsule Take 20 mg by mouth daily.    Marland Kitchen oxyCODONE-acetaminophen (PERCOCET/ROXICET) 5-325 MG per tablet 1 or 2 tabs PO q6h prn pain  . [DISCONTINUED] glyBURIDE-metformin (GLUCOVANCE) 5-500 MG per tablet Take 1 tablet by mouth 2 (two) times daily.     No facility-administered encounter medications on file as of 03/10/2015.   ALLERGIES: No Known Allergies VACCINATION STATUS:  There is no immunization history on file for this patient.  Diabetes He presents for his initial diabetic visit. He has type 2 diabetes mellitus. Onset time: He was diagnosed when he was 59 yr old. His disease course has been worsening. There are no hypoglycemic associated symptoms. Pertinent negatives for hypoglycemia include no confusion, headaches, pallor or seizures. Associated symptoms include polydipsia and polyuria. Pertinent negatives for diabetes include no chest pain, no fatigue, no polyphagia and no weakness. There are no hypoglycemic complications. Symptoms are worsening. Diabetic complications include peripheral neuropathy. Risk factors for coronary artery disease include diabetes mellitus, dyslipidemia, hypertension, male sex, obesity and  sedentary lifestyle. Current diabetic treatment includes oral agent (dual therapy). He is compliant with treatment most of the time. He is following a generally unhealthy diet. When asked about meal planning, he reported none. He never participates in exercise. Home blood sugar record trend: he does not monitor BG. (He did not  Bring any meter nor logs to review.) An ACE inhibitor/angiotensin II receptor blocker is not being taken.   Patient denies history of CAD, CVA, CKD, and Retinopathy. Patient has  family history  of type 2 DM. Patient is a non smoker,  does not  participate in a regular exercise program, has been obese most of his adult life. Patient is willing to engage in intensive monitoring and therapy along with change in life style. He does not read and write.   Review of Systems  Constitutional: Negative for fatigue and unexpected weight change.  HENT: Negative for dental problem, mouth sores and trouble swallowing.   Eyes: Negative for visual disturbance.  Respiratory: Negative for cough, choking, chest tightness, shortness of breath and wheezing.   Cardiovascular: Negative for chest pain, palpitations and leg swelling.  Gastrointestinal: Negative for nausea, vomiting, abdominal pain, diarrhea, constipation and abdominal distention.  Endocrine: Positive for polydipsia and polyuria. Negative for polyphagia.  Genitourinary: Negative for dysuria, urgency, hematuria and flank pain.  Musculoskeletal: Negative for myalgias, back pain, gait problem and neck pain.  Skin: Negative for pallor, rash and wound.  Neurological: Negative for seizures, syncope, weakness, numbness and headaches.  Psychiatric/Behavioral: Negative.  Negative for confusion and dysphoric mood.    Objective:    BP 146/77 mmHg  Pulse 101  Ht  (1.727 m)  Wt 250 lb (113.399 kg)  BMI 38.02 kg/m2  SpO2 95%  Wt Readings from Last 3 Encounters:  03/10/15 250 lb (113.399 kg)  09/07/13 250 lb (113.399 kg)  04/02/12 254 lb 12.8 oz (115.577 kg)    Physical Exam  Constitutional: He is oriented to person, place, and time. He appears well-developed and well-nourished. He is cooperative. No distress.  HENT:  Head: Normocephalic and atraumatic.  Eyes: EOM are normal.  Neck: Normal range of motion. Neck supple. No tracheal deviation present. No thyromegaly present.  Cardiovascular: Normal rate, S1 normal, S2 normal and normal heart sounds.  Exam reveals no gallop.   No murmur heard. Pulses:      Dorsalis pedis pulses are 1+ on  the right side, and 1+ on the left side.       Posterior tibial pulses are 1+ on the right side, and 1+ on the left side.  Pulmonary/Chest: Breath sounds normal. No respiratory distress. He has no wheezes.  Abdominal: Soft. Bowel sounds are normal. He exhibits no distension. There is no tenderness. There is no guarding and no CVA tenderness.  Musculoskeletal: He exhibits no edema.       Right shoulder: He exhibits no swelling and no deformity.  Neurological: He is alert and oriented to person, place, and time. He has normal strength and normal reflexes. No cranial nerve deficit or sensory deficit. Gait normal.  Skin: Skin is warm and dry. No rash noted. No cyanosis. Nails show no clubbing.  Psychiatric: He has a normal mood and affect. His speech is normal. Judgment normal. Cognition and memory are normal.    Results for orders placed or performed in visit on 03/10/15  Hemoglobin A1c  Result Value Ref Range   Hgb A1c MFr Bld 9.6 (A) 4.0 - 6.0 %   Complete Blood Count (Most  recent): Lab Results  Component Value Date   HGB 16.8 04/02/2012   HCT 48.2 04/02/2012   Chemistry (most recent): Lab Results  Component Value Date   NA 132* 04/02/2012   K 4.1 04/02/2012   CL 93* 04/02/2012   CO2 31 04/02/2012   BUN 6 04/02/2012   CREATININE 0.63 04/02/2012     Assessment & Plan:   1. Uncontrolled type 2 diabetes mellitus with complication, without long-term current use of insulin (HCC)  - Patient has currently uncontrolled symptomatic type 2 DM since  59 years of age,  with most recent A1c of 9.6 % from July 2016. He does not have recent labs to review. -his  diabetes is complicated by neuropathy and patient remains at a high risk for more acute and chronic complications of diabetes which include CAD, CVA, CKD, retinopathy, and neuropathy. These are all discussed in detail with the patient.  - I have counseled the patient on diet management and weight loss, by adopting a carbohydrate  restricted/protein rich diet.  - Suggestion is made for patient to avoid simple carbohydrates   from their diet including Cakes , Desserts, Ice Cream,  Soda (  diet and regular) , Sweet Tea , Candies,  Chips, Cookies, Artificial Sweeteners,   and "Sugar-free" Products . This will help patient to have stable blood glucose profile and potentially avoid unintended weight gain.  - I encouraged the patient to switch to  unprocessed or minimally processed complex starch and increased protein intake (animal or plant source), fruits, and vegetables.  - Patient is advised to stick to a routine mealtimes to eat 3 meals  a day and avoid unnecessary snacks ( to snack only to correct hypoglycemia).  - The patient will be scheduled with Norm Salt, RDN, CDE for individualized DM education.  - I have approached patient with the following individualized plan to manage diabetes and patient agrees:   - He may need intensive insulin therapy to control his glycemia, but he can not read and write. - I  advised him to bring one week worth of glycemic profile by monitoring of glucose  AC and HS. -First priority on this patient will be avoiding inadvertent hypoglycemia. He has to be on safe oral regimen. I will discontinue his Glucovance, and we will discontinue his Januvia for efficacy reasons. -I will continue with metformin 1000 g by mouth twice a day and add Invokana 100 mg by mouth every morning. Side effects and precaution are discussed with patient.  -Patient is encouraged to call clinic for blood glucose levels less than 70 or above 300 mg /dl.  - Patient will be considered for incretin therapy as appropriate next visit. - Patient specific target  A1c;  LDL, HDL, Triglycerides, and  Waist Circumference were discussed in detail.  2) BP/HTN: Uncontrolled, his medication list does not include ACEI/ARB. I will consider low-dose lisinopril visit. 3) Lipids/HPL:  Controlled unknown, he is not on statins, will  consider low-dose statin next visit.  4)  obesity: CDE Consult will be initiated , exercise, and detailed carbohydrates information provided.  5) Chronic Care/Health Maintenance:  -Patient is encouraged to continue to follow up with Ophthalmology, Podiatrist at least yearly or according to recommendations, and advised to   stay away from smoking. I have recommended yearly flu vaccine and pneumonia vaccination at least every 5 years; moderate intensity exercise for up to 150 minutes weekly; and  sleep for at least 7 hours a day.  - 60 minutes of  time was spent on the care of this patient , 50% of which was applied for counseling on diabetes complications and their preventions.  - Patient to bring meter and  blood glucose logs during their next visit.   - I advised patient to maintain close follow up with HAWKINS,EDWARD L, MD for primary care needs.  Follow up plan: - Return in about 10 days (around 03/20/2015) for diabetes, follow up with pre-visit labs, meter, and logs.  Marquis LunchGebre Calissa Swenor, MD Phone: [phone]  Fax: [phone]   03/10/2015, 9:40 PM

## 2015-03-10 NOTE — Patient Instructions (Signed)

## 2015-03-19 LAB — BASIC METABOLIC PANEL
BUN: 9 mg/dL (ref 7–25)
CALCIUM: 8.8 mg/dL (ref 8.6–10.3)
CO2: 32 mmol/L — AB (ref 20–31)
Chloride: 101 mmol/L (ref 98–110)
Creat: 0.59 mg/dL — ABNORMAL LOW (ref 0.70–1.33)
GLUCOSE: 166 mg/dL — AB (ref 65–99)
POTASSIUM: 4.1 mmol/L (ref 3.5–5.3)
SODIUM: 138 mmol/L (ref 135–146)

## 2015-03-19 LAB — HEMOGLOBIN A1C
Hgb A1c MFr Bld: 9.8 % — ABNORMAL HIGH (ref <5.7)
Mean Plasma Glucose: 235 mg/dL — ABNORMAL HIGH (ref <117)

## 2015-03-19 LAB — T4, FREE: Free T4: 1.11 ng/dL (ref 0.80–1.80)

## 2015-03-19 LAB — TSH: TSH: 1.361 u[IU]/mL (ref 0.350–4.500)

## 2015-03-22 ENCOUNTER — Encounter: Payer: Medicare Other | Admitting: "Endocrinology

## 2015-03-22 ENCOUNTER — Encounter: Payer: Self-pay | Admitting: "Endocrinology

## 2015-03-25 NOTE — Progress Notes (Signed)
This encounter was created in error - please disregard.

## 2015-04-02 ENCOUNTER — Encounter: Payer: Self-pay | Admitting: "Endocrinology

## 2015-04-02 ENCOUNTER — Ambulatory Visit (INDEPENDENT_AMBULATORY_CARE_PROVIDER_SITE_OTHER): Payer: Medicare Other | Admitting: "Endocrinology

## 2015-04-02 VITALS — BP 149/78 | HR 92 | Ht 68.0 in | Wt 249.0 lb

## 2015-04-02 DIAGNOSIS — I10 Essential (primary) hypertension: Secondary | ICD-10-CM | POA: Insufficient documentation

## 2015-04-02 DIAGNOSIS — E1165 Type 2 diabetes mellitus with hyperglycemia: Secondary | ICD-10-CM | POA: Diagnosis not present

## 2015-04-02 DIAGNOSIS — E118 Type 2 diabetes mellitus with unspecified complications: Secondary | ICD-10-CM | POA: Diagnosis not present

## 2015-04-02 DIAGNOSIS — IMO0002 Reserved for concepts with insufficient information to code with codable children: Secondary | ICD-10-CM

## 2015-04-02 MED ORDER — LISINOPRIL-HYDROCHLOROTHIAZIDE 10-12.5 MG PO TABS: 1.0000 | ORAL_TABLET | Freq: Every day | ORAL | Status: DC

## 2015-04-02 NOTE — Patient Instructions (Signed)

## 2015-04-03 NOTE — Progress Notes (Signed)
Subjective:    Patient ID: Dwayne Davies, male    DOB: 09-Feb-1956. Patient is being seen in consultation for management of diabetes requested by  Fredirick Maudlin, MD  Past Medical History  Diagnosis Date  . Diabetes mellitus   . GERD (gastroesophageal reflux disease)   . Anxiety   . Chest pain   . Elevated lipids   . Shortness of breath   . Gout   . Chronic back pain   . COPD (chronic obstructive pulmonary disease) (HCC)   . Chronic pain in left shoulder   . Normal cardiac stress test 2000, 2005   Past Surgical History  Procedure Laterality Date  . Cataract extraction w/phaco  04/08/2012    Procedure: CATARACT EXTRACTION PHACO AND INTRAOCULAR LENS PLACEMENT (IOC);  Surgeon: Susa Simmonds, MD;  Location: [facility];  Service: Ophthalmology;  Laterality: Right;  CDE:40.75  . Cardiac catheterization      x2 (last 2001), both normal coronary arteries   Social History   Social History  . Marital Status: Widowed    Spouse Name: N/A  . Number of Children: N/A  . Years of Education: N/A   Social History Main Topics  . Smoking status: Never Smoker   . Smokeless tobacco: None  . Alcohol Use: No  . Drug Use: No  . Sexual Activity: Yes   Other Topics Concern  . None   Social History Narrative   Lives with only son   No regular drug use   Outpatient Encounter Prescriptions as of 04/02/2015  Medication Sig  . albuterol (PROVENTIL HFA;VENTOLIN HFA) 108 (90 BASE) MCG/ACT inhaler Inhale 2 puffs into the lungs every 6 (six) hours as needed. Shortness of Breath  . Blood Glucose Monitoring Suppl (ACCU-CHEK AVIVA) device Use as instructed  . canagliflozin (INVOKANA) 100 MG TABS tablet Take 1 tablet (100 mg total) by mouth daily before breakfast.  . glucose blood (ACCU-CHEK AVIVA) test strip Use to test glucose 4 times a day.  . metFORMIN (GLUCOPHAGE) 1000 MG tablet Take 1 tablet (1,000 mg total) by mouth 2 (two) times daily with a meal.  . omeprazole (PRILOSEC) 20 MG capsule Take  20 mg by mouth daily.    Marland Kitchen oxyCODONE-acetaminophen (PERCOCET/ROXICET) 5-325 MG per tablet 1 or 2 tabs PO q6h prn pain  . tamsulosin (FLOMAX) 0.4 MG CAPS capsule Take 0.4 mg by mouth.  . colchicine 0.6 MG tablet Take 0.6 mg by mouth daily.  . furosemide (LASIX) 40 MG tablet Take 40 mg by mouth.  Marland Kitchen lisinopril-hydrochlorothiazide (PRINZIDE,ZESTORETIC) 10-12.5 MG tablet Take 1 tablet by mouth daily.  . meclizine (ANTIVERT) 25 MG tablet Take 25 mg by mouth 3 (three) times daily as needed for dizziness.   No facility-administered encounter medications on file as of 04/02/2015.   ALLERGIES: No Known Allergies VACCINATION STATUS:  There is no immunization history on file for this patient.  Diabetes He presents for his follow-up diabetic visit. He has type 2 diabetes mellitus. Onset time: He was diagnosed when he was 59 yr old. His disease course has been worsening. There are no hypoglycemic associated symptoms. Pertinent negatives for hypoglycemia include no confusion, headaches, pallor or seizures. Associated symptoms include polydipsia and polyuria. Pertinent negatives for diabetes include no chest pain, no fatigue, no polyphagia and no weakness. There are no hypoglycemic complications. Symptoms are worsening. Diabetic complications include peripheral neuropathy. Risk factors for coronary artery disease include diabetes mellitus, dyslipidemia, hypertension, male sex, obesity and sedentary lifestyle. Current diabetic treatment includes oral  agent (dual therapy). He is compliant with treatment some of the time. He is following a generally unhealthy diet. When asked about meal planning, he reported none. He never participates in exercise. Home blood sugar record trend: Despite my advice for him to monitor 4 times a day and return with logs in meter, he monitored only 2 times in the last 14 days. (He did not  monitor blood glucose.) An ACE inhibitor/angiotensin II receptor blocker is not being taken.    Patient denies history of CAD, CVA, CKD, and Retinopathy. Patient has  family history of type 2 DM. Patient is a non smoker,  does not  participate in a regular exercise program, has been obese most of his adult life. Patient is willing to engage in intensive monitoring and therapy along with change in life style. He does not read and write.   Review of Systems  Constitutional: Negative for fatigue and unexpected weight change.  HENT: Negative for dental problem, mouth sores and trouble swallowing.   Eyes: Negative for visual disturbance.  Respiratory: Negative for cough, choking, chest tightness, shortness of breath and wheezing.   Cardiovascular: Negative for chest pain, palpitations and leg swelling.  Gastrointestinal: Negative for nausea, vomiting, abdominal pain, diarrhea, constipation and abdominal distention.  Endocrine: Positive for polydipsia and polyuria. Negative for polyphagia.  Genitourinary: Negative for dysuria, urgency, hematuria and flank pain.  Musculoskeletal: Negative for myalgias, back pain, gait problem and neck pain.  Skin: Negative for pallor, rash and wound.  Neurological: Negative for seizures, syncope, weakness, numbness and headaches.  Psychiatric/Behavioral: Negative.  Negative for confusion and dysphoric mood.    Objective:    BP 149/78 mmHg  Pulse 92  Ht 5\' 8"  (1.727 m)  Wt 249 lb (112.946 kg)  BMI 37.87 kg/m2  SpO2 95%  Wt Readings from Last 3 Encounters:  04/02/15 249 lb (112.946 kg)  03/10/15 250 lb (113.399 kg)  09/07/13 250 lb (113.399 kg)    Physical Exam  Constitutional: He is oriented to person, place, and time. He appears well-developed and well-nourished. He is cooperative. No distress.  HENT:  Head: Normocephalic and atraumatic.  Eyes: EOM are normal.  Neck: Normal range of motion. Neck supple. No tracheal deviation present. No thyromegaly present.  Cardiovascular: Normal rate, S1 normal, S2 normal and normal heart sounds.  Exam  reveals no gallop.   No murmur heard. Pulses:      Dorsalis pedis pulses are 1+ on the right side, and 1+ on the left side.       Posterior tibial pulses are 1+ on the right side, and 1+ on the left side.  Pulmonary/Chest: Breath sounds normal. No respiratory distress. He has no wheezes.  Abdominal: Soft. Bowel sounds are normal. He exhibits no distension. There is no tenderness. There is no guarding and no CVA tenderness.  Musculoskeletal: He exhibits no edema.       Right shoulder: He exhibits no swelling and no deformity.  Neurological: He is alert and oriented to person, place, and time. He has normal strength and normal reflexes. No cranial nerve deficit or sensory deficit. Gait normal.  Skin: Skin is warm and dry. No rash noted. No cyanosis. Nails show no clubbing.  Psychiatric: He has a normal mood and affect. His speech is normal. Judgment normal. Cognition and memory are normal.    Results for orders placed or performed in visit on 03/10/15  Basic metabolic panel  Result Value Ref Range   Sodium 138 135 - 146 mmol/L  Potassium 4.1 3.5 - 5.3 mmol/L   Chloride 101 98 - 110 mmol/L   CO2 32 (H) 20 - 31 mmol/L   Glucose, Bld 166 (H) 65 - 99 mg/dL   BUN 9 7 - 25 mg/dL   Creat 1.61 (L) 0.96 - 1.33 mg/dL   Calcium 8.8 8.6 - 04.5 mg/dL  Hemoglobin W0J  Result Value Ref Range   Hgb A1c MFr Bld 9.8 (H) <5.7 %   Mean Plasma Glucose 235 (H) <117 mg/dL  T4, free  Result Value Ref Range   Free T4 1.11 0.80 - 1.80 ng/dL  TSH  Result Value Ref Range   TSH 1.361 0.350 - 4.500 uIU/mL  Hemoglobin A1c  Result Value Ref Range   Hgb A1c MFr Bld 9.6 (A) 4.0 - 6.0 %   Complete Blood Count (Most recent): Lab Results  Component Value Date   HGB 16.8 04/02/2012   HCT 48.2 04/02/2012     Assessment & Plan:   1. Uncontrolled type 2 diabetes mellitus with complication, without long-term current use of insulin (HCC)  - Patient has currently uncontrolled symptomatic type 2 DM since  59  years of age,  with most recent A1c of 9.6 % from July 2016. He does not have recent labs to review. -his  diabetes is complicated by neuropathy and patient remains at a high risk for more acute and chronic complications of diabetes which include CAD, CVA, CKD, retinopathy, and neuropathy. These are all discussed in detail with the patient.  - I have counseled the patient on diet management and weight loss, by adopting a carbohydrate restricted/protein rich diet.  - Suggestion is made for patient to avoid simple carbohydrates   from their diet including Cakes , Desserts, Ice Cream,  Soda (  diet and regular) , Sweet Tea , Candies,  Chips, Cookies, Artificial Sweeteners,   and "Sugar-free" Products . This will help patient to have stable blood glucose profile and potentially avoid unintended weight gain.  - I encouraged the patient to switch to  unprocessed or minimally processed complex starch and increased protein intake (animal or plant source), fruits, and vegetables.  - Patient is advised to stick to a routine mealtimes to eat 3 meals  a day and avoid unnecessary snacks ( to snack only to correct hypoglycemia).  - The patient will be scheduled with Norm Salt, RDN, CDE for individualized DM education.  - I have approached patient with the following individualized plan to manage diabetes and patient agrees:   - Based on his recent A1c and symptomatic hypoglycemia, he would need at least basal insulin, however he did not commit to monitor blood glucose.  - His risk of hypoglycemia from inappropriate use of insulin due to the fact he can not read and write.  -First priority on this patient will be avoiding inadvertent hypoglycemia.  - He has to be on safe oral regimen.  -I will continue with metformin 1000 g by mouth twice a day and and Invokana 100 mg by mouth every morning. Side effects and precaution are discussed with patient. -If A1c remains high during his next visit, I will maximize  Invokana.   - Patient will be considered for incretin therapy as appropriate next visit. - Patient specific target  A1c;  LDL, HDL, Triglycerides, and  Waist Circumference were discussed in detail.  2) BP/HTN: Uncontrolled, his medication list does not include ACEI/ARB. I will consider low-dose lisinopril visit. 3) Lipids/HPL:  Controlled unknown, he is not on statins,  will consider low-dose statin next visit.  4)  obesity: CDE Consult will be initiated , exercise, and detailed carbohydrates information provided.  5) Chronic Care/Health Maintenance:  -Patient is encouraged to continue to follow up with Ophthalmology, Podiatrist at least yearly or according to recommendations, and advised to   stay away from smoking. I have recommended yearly flu vaccine and pneumonia vaccination at least every 5 years; moderate intensity exercise for up to 150 minutes weekly; and  sleep for at least 7 hours a day.  - 25 minutes of time was spent on the care of this patient , 50% of which was applied for counseling on diabetes complications and their preventions.  - Patient to bring meter and  blood glucose logs during their next visit.   - I advised patient to maintain close follow up with HAWKINS,EDWARD L, MD for primary care needs.  Follow up plan: - Return in about 10 weeks (around 06/11/2015) for diabetes, high blood pressure, follow up with pre-visit labs.  Marquis Lunch, MD Phone: [phone]  Fax: [phone]   04/03/2015, 7:29 AM

## 2015-05-03 ENCOUNTER — Encounter: Payer: Medicare Other | Attending: "Endocrinology | Admitting: Nutrition

## 2015-05-03 ENCOUNTER — Encounter: Payer: Self-pay | Admitting: Nutrition

## 2015-05-03 VITALS — Ht 68.0 in | Wt 243.0 lb

## 2015-05-03 DIAGNOSIS — Z794 Long term (current) use of insulin: Secondary | ICD-10-CM

## 2015-05-03 DIAGNOSIS — E1165 Type 2 diabetes mellitus with hyperglycemia: Secondary | ICD-10-CM | POA: Diagnosis not present

## 2015-05-03 DIAGNOSIS — E118 Type 2 diabetes mellitus with unspecified complications: Secondary | ICD-10-CM | POA: Insufficient documentation

## 2015-05-03 DIAGNOSIS — IMO0002 Reserved for concepts with insufficient information to code with codable children: Secondary | ICD-10-CM

## 2015-05-03 DIAGNOSIS — E669 Obesity, unspecified: Secondary | ICD-10-CM

## 2015-05-03 NOTE — Progress Notes (Signed)
  Medical Nutrition Therapy:  Appt start time: 0800 end time:  0930.  Assessment:  Primary concerns today:Diabetes type 2.. Lives by himself. Gets disability for nerve problems in legs and hands and wrists. He cooks some but eats out a lot since he lives by himself. He goes and eats with his aunt a lot who fixes lots of 'country' foods. He has a 6th grade education. He can't read or write  well. HIs sister and girlfriend help him. His sister helps him test his blood sugars a few times per week. He is limited with his hands to be able to test blood sugars on his own.. Is wearing lose fitting shoes.. Needs referral to podiatrist. Has has lost first two toes on right foot due to lawnmower accident when he was 60 yrs old. Has limited balanced due to that. Currently taking Metformin 1000 mg BID and Invokana daily. Compliant with meds. Has been drinking sodas in the past, but now drinking lemon water.[place]. History of falls due to poor balance and problems with nerve sensation in feet and hands. Diet is excessive in carbs, fat and sodium and low in fresh fruits and vegetables. He needs a  Prescription for diabetic shoes.  Lab Results  Component Value Date   HGBA1C 9.8* 03/18/2015   Preferred Learning Style:  Auditory  Visual but can't read well.  Hands on   Learning Readineness  Ready  Change in progress  MEDICATIONS:   DIETARY INTAKE:   24-hr recall:  B ( AM): egg, gravy and biscuit 1, coffee, water Snk ( AM): L ( PM): Chicken salad sandwich on white bread, pickles, water Snk ( PM):  D ( PM): steak, potatoes, water and toss salad  Snk ( PM): Beverages: water  Usual physical activity: walks,   Estimated energy needs: 1800  calories 200  g carbohydrates 135 g protein 50 g fat  Progress Towards Goal(s):  In progress.   Nutritional Diagnosis:  NB-1.1 Food and nutrition-related knowledge deficit As related to DM.  As evidenced by A1C 9.8 %.     Intervention:  Nutrition and Diabetes education provided on My Plate, CHO counting, meal planning, portion sizes, timing of meals, avoiding snacks between meals unless having a low blood sugar, target ranges for A1C and blood sugars, signs/symptoms and treatment of hyper/hypoglycemia, monitoring blood sugars, taking medications as prescribed, benefits of exercising 30 minutes per day and prevention of complications of DM.  Goals: 1. Follow My Plate Method 2. Cut out sodas, juice and tea and only drink water. 3. Increase fresh fruits and vegetables 4. Drink only water 5. Choose 3-4 carb choices per meal. 6. Do not skip meals. Avoid cakes, cookies, pies and sweets. 7. Reduce portion of bread, potatoes and beans and eat more greens, cabbage, salads. 8. Walk 30 minutes or longer every day.  Teaching Method Utilized:  Visual Auditory Hands on  Handouts given during visit include:  The Plate Method  Meal Plan Card  Diabetes Instructions   Barriers to learning/adherence to lifestyle change: Cognitive deficients-can't read or write well.  Demonstrated degree of understanding via:  Teach Back   Monitoring/Evaluation:  Dietary intake, exercise, meal planning,and body weight in 1 month(s). He needs referral to podiatrist and a prescription to get diabetic shoes.

## 2015-05-03 NOTE — Patient Instructions (Addendum)
Goals: 1. Follow My Plate Method 2. Cut out sodas, juice and tea and only drink water. 3. Increase fresh fruits and vegetables 4. Drink only water 5. Choose 3-4 carb choices per meal. 6. Do not skip meals. Avoid cakes, cookies, pies and sweets. 7. Reduce portion of bread, potatoes and beans and eat more greens, cabbage, salads. 8. Walk 30 minutes or longer every day. 9. Get A1C down to 8% in three months.

## 2015-05-12 ENCOUNTER — Other Ambulatory Visit: Payer: Self-pay | Admitting: "Endocrinology

## 2015-05-19 ENCOUNTER — Other Ambulatory Visit: Payer: Self-pay

## 2015-05-19 MED ORDER — LISINOPRIL-HYDROCHLOROTHIAZIDE 10-12.5 MG PO TABS: 1.0000 | ORAL_TABLET | Freq: Every day | ORAL | Status: DC

## 2015-06-04 ENCOUNTER — Other Ambulatory Visit: Payer: Self-pay | Admitting: "Endocrinology

## 2015-06-04 LAB — LIPID PANEL
CHOLESTEROL: 167 mg/dL (ref 125–200)
HDL: 48 mg/dL (ref >=40)
LDL Cholesterol: 92 mg/dL (ref <130)
TRIGLYCERIDES: 133 mg/dL (ref <150)
Total CHOL/HDL Ratio: 3.5 Ratio (ref <=5.0)
VLDL: 27 mg/dL (ref <30)

## 2015-06-04 LAB — TSH: TSH: 1.27 mIU/L (ref 0.40–4.50)

## 2015-06-04 LAB — T4, FREE: FREE T4: 1.1 ng/dL (ref 0.8–1.8)

## 2015-06-04 LAB — BASIC METABOLIC PANEL
BUN: 10 mg/dL (ref 7–25)
CALCIUM: 9.3 mg/dL (ref 8.6–10.3)
CO2: 31 mmol/L (ref 20–31)
CREATININE: 0.59 mg/dL — AB (ref 0.70–1.33)
Chloride: 94 mmol/L — ABNORMAL LOW (ref 98–110)
GLUCOSE: 172 mg/dL — AB (ref 65–99)
POTASSIUM: 4.3 mmol/L (ref 3.5–5.3)
Sodium: 134 mmol/L — ABNORMAL LOW (ref 135–146)

## 2015-06-04 LAB — HEMOGLOBIN A1C
HEMOGLOBIN A1C: 8.7 % — AB (ref <5.7)
MEAN PLASMA GLUCOSE: 203 mg/dL — AB (ref <117)

## 2015-06-05 LAB — MICROALBUMIN / CREATININE URINE RATIO: Creatinine, Urine: 46 mg/dL (ref 20–370)

## 2015-06-11 ENCOUNTER — Ambulatory Visit: Payer: Medicare Other | Admitting: "Endocrinology

## 2015-06-14 ENCOUNTER — Ambulatory Visit (INDEPENDENT_AMBULATORY_CARE_PROVIDER_SITE_OTHER): Payer: Medicare Other | Admitting: "Endocrinology

## 2015-06-14 ENCOUNTER — Encounter: Payer: Self-pay | Admitting: "Endocrinology

## 2015-06-14 ENCOUNTER — Ambulatory Visit: Payer: Medicare Other | Admitting: Nutrition

## 2015-06-14 VITALS — BP 127/78 | HR 97 | Ht 68.0 in | Wt 241.0 lb

## 2015-06-14 DIAGNOSIS — E1165 Type 2 diabetes mellitus with hyperglycemia: Secondary | ICD-10-CM

## 2015-06-14 DIAGNOSIS — IMO0002 Reserved for concepts with insufficient information to code with codable children: Secondary | ICD-10-CM

## 2015-06-14 DIAGNOSIS — E118 Type 2 diabetes mellitus with unspecified complications: Secondary | ICD-10-CM

## 2015-06-14 DIAGNOSIS — I10 Essential (primary) hypertension: Secondary | ICD-10-CM

## 2015-06-14 MED ORDER — CANAGLIFLOZIN 100 MG PO TABS: 300.0000 mg | ORAL_TABLET | Freq: Every day | ORAL | Status: DC

## 2015-06-14 MED ORDER — METFORMIN HCL 1000 MG PO TABS: 1000.0000 mg | ORAL_TABLET | Freq: Two times a day (BID) | ORAL | Status: AC

## 2015-06-14 NOTE — Patient Instructions (Signed)

## 2015-06-14 NOTE — Progress Notes (Signed)
Subjective:    Patient ID: Dwayne Davies, male    DOB: [birth year]. Patient is being seen in consultation for management of diabetes requested by  Fredirick Maudlin, MD  Past Medical History  Diagnosis Date  . Diabetes mellitus   . GERD (gastroesophageal reflux disease)   . Anxiety   . Chest pain   . Elevated lipids   . Shortness of breath   . Gout   . Chronic back pain   . COPD (chronic obstructive pulmonary disease) (HCC)   . Chronic pain in left shoulder   . Normal cardiac stress test 2000, 2005  . Hypertension   . Gout    Past Surgical History  Procedure Laterality Date  . Cataract extraction w/phaco  04/08/2012    Procedure: CATARACT EXTRACTION PHACO AND INTRAOCULAR LENS PLACEMENT (IOC);  Surgeon: Susa Simmonds, MD;  Location: [facility];  Service: Ophthalmology;  Laterality: Right;  CDE:40.75  . Cardiac catheterization      x2 (last 2001), both normal coronary arteries   Social History   Social History  . Marital Status: Widowed    Spouse Name: N/A  . Number of Children: N/A  . Years of Education: N/A   Social History Main Topics  . Smoking status: Never Smoker   . Smokeless tobacco: None  . Alcohol Use: No  . Drug Use: No  . Sexual Activity: Yes   Other Topics Concern  . None   Social History Narrative   Lives with only son   No regular drug use   Outpatient Encounter Prescriptions as of 06/14/2015  Medication Sig  . canagliflozin (INVOKANA) 100 MG TABS tablet Take 3 tablets (300 mg total) by mouth daily before breakfast.  . lisinopril-hydrochlorothiazide (PRINZIDE,ZESTORETIC) 10-12.5 MG tablet Take 1 tablet by mouth daily.  . metFORMIN (GLUCOPHAGE) 1000 MG tablet Take 1 tablet (1,000 mg total) by mouth 2 (two) times daily with a meal.  . [DISCONTINUED] INVOKANA 100 MG TABS tablet TAKE 1 TABLET BY MOUTH DAILY BEFORE BREAKFAST.  . [DISCONTINUED] metFORMIN (GLUCOPHAGE) 1000 MG tablet Take 1 tablet (1,000 mg total) by mouth 2 (two) times daily with a meal.   . albuterol (PROVENTIL HFA;VENTOLIN HFA) 108 (90 BASE) MCG/ACT inhaler Inhale 2 puffs into the lungs every 6 (six) hours as needed. Shortness of Breath  . Blood Glucose Monitoring Suppl (ACCU-CHEK AVIVA) device Use as instructed  . colchicine 0.6 MG tablet Take 0.6 mg by mouth daily.  . furosemide (LASIX) 40 MG tablet Take 40 mg by mouth.  Marland Kitchen glucose blood (ACCU-CHEK AVIVA) test strip Use to test glucose 4 times a day.  . meclizine (ANTIVERT) 25 MG tablet Take 25 mg by mouth 3 (three) times daily as needed for dizziness.  Marland Kitchen omeprazole (PRILOSEC) 20 MG capsule Take 20 mg by mouth daily.    Marland Kitchen oxyCODONE-acetaminophen (PERCOCET/ROXICET) 5-325 MG per tablet 1 or 2 tabs PO q6h prn pain  . tamsulosin (FLOMAX) 0.4 MG CAPS capsule Take 0.4 mg by mouth.   No facility-administered encounter medications on file as of 06/14/2015.   ALLERGIES: No Known Allergies VACCINATION STATUS:  There is no immunization history on file for this patient.  Diabetes He presents for his follow-up diabetic visit. He has type 2 diabetes mellitus. Onset time: He was diagnosed when he was 60 yr old. His disease course has been improving. There are no hypoglycemic associated symptoms. Pertinent negatives for hypoglycemia include no confusion, headaches, pallor or seizures. Associated symptoms include polydipsia and polyuria. Pertinent negatives for  diabetes include no chest pain, no fatigue, no polyphagia and no weakness. There are no hypoglycemic complications. Symptoms are improving. Diabetic complications include peripheral neuropathy. Risk factors for coronary artery disease include diabetes mellitus, dyslipidemia, hypertension, male sex, obesity and sedentary lifestyle. Current diabetic treatment includes oral agent (dual therapy). He is compliant with treatment some of the time. His weight is decreasing steadily. He is following a generally unhealthy diet. When asked about meal planning, he reported none. He never participates  in exercise. Home blood sugar record trend: he does not monitor blood glucose. An ACE inhibitor/angiotensin II receptor blocker is not being taken.   Patient denies history of CAD, CVA, CKD, and Retinopathy. Patient has  family history of type 2 DM. Patient is a non smoker,  does not  participate in a regular exercise program, has been obese most of his adult life. He does not read and write.   Review of Systems  Constitutional: Negative for fatigue and unexpected weight change.  HENT: Negative for dental problem, mouth sores and trouble swallowing.   Eyes: Negative for visual disturbance.  Respiratory: Negative for cough, choking, chest tightness, shortness of breath and wheezing.   Cardiovascular: Negative for chest pain, palpitations and leg swelling.  Gastrointestinal: Negative for nausea, vomiting, abdominal pain, diarrhea, constipation and abdominal distention.  Endocrine: Positive for polydipsia and polyuria. Negative for polyphagia.  Genitourinary: Negative for dysuria, urgency, hematuria and flank pain.  Musculoskeletal: Negative for myalgias, back pain, gait problem and neck pain.  Skin: Negative for pallor, rash and wound.  Neurological: Negative for seizures, syncope, weakness, numbness and headaches.  Psychiatric/Behavioral: Negative.  Negative for confusion and dysphoric mood.    Objective:    BP 127/78 mmHg  Pulse 97  Ht 5\' 8"  (1.727 m)  Wt 241 lb (109.317 kg)  BMI 36.65 kg/m2  SpO2 94%  Wt Readings from Last 3 Encounters:  06/14/15 241 lb (109.317 kg)  05/03/15 243 lb (110.224 kg)  04/02/15 249 lb (112.946 kg)    Physical Exam  Constitutional: He is oriented to person, place, and time. He appears well-developed and well-nourished. He is cooperative. No distress.  HENT:  Head: Normocephalic and atraumatic.  Eyes: EOM are normal.  Neck: Normal range of motion. Neck supple. No tracheal deviation present. No thyromegaly present.  Cardiovascular: Normal rate, S1  normal, S2 normal and normal heart sounds.  Exam reveals no gallop.   No murmur heard. Pulses:      Dorsalis pedis pulses are 1+ on the right side, and 1+ on the left side.       Posterior tibial pulses are 1+ on the right side, and 1+ on the left side.  Pulmonary/Chest: Breath sounds normal. No respiratory distress. He has no wheezes.  Abdominal: Soft. Bowel sounds are normal. He exhibits no distension. There is no tenderness. There is no guarding and no CVA tenderness.  Musculoskeletal: He exhibits no edema.       Right shoulder: He exhibits no swelling and no deformity.  Neurological: He is alert and oriented to person, place, and time. He has normal strength and normal reflexes. No cranial nerve deficit or sensory deficit. Gait normal.  Skin: Skin is warm and dry. No rash noted. No cyanosis. Nails show no clubbing.  Psychiatric: He has a normal mood and affect. His speech is normal. Judgment normal. Cognition and memory are normal.    Results for orders placed or performed in visit on 06/04/15  Basic metabolic panel  Result Value Ref Range  Sodium 134 (L) 135 - 146 mmol/L   Potassium 4.3 3.5 - 5.3 mmol/L   Chloride 94 (L) 98 - 110 mmol/L   CO2 31 20 - 31 mmol/L   Glucose, Bld 172 (H) 65 - 99 mg/dL   BUN 10 7 - 25 mg/dL   Creat 4.78 (L) 2.95 - 1.33 mg/dL   Calcium 9.3 8.6 - 62.1 mg/dL  Lipid panel  Result Value Ref Range   Cholesterol 167 125 - 200 mg/dL   Triglycerides 308 <657 mg/dL   HDL 48 >=84 mg/dL   Total CHOL/HDL Ratio 3.5 <=5.0 Ratio   VLDL 27 <30 mg/dL   LDL Cholesterol 92 <696 mg/dL  TSH  Result Value Ref Range   TSH 1.27 0.40 - 4.50 mIU/L  T4, free  Result Value Ref Range   Free T4 1.1 0.8 - 1.8 ng/dL  Hemoglobin E9B  Result Value Ref Range   Hgb A1c MFr Bld 8.7 (H) <5.7 %   Mean Plasma Glucose 203 (H) <117 mg/dL   Complete Blood Count (Most recent): Lab Results  Component Value Date   HGB 16.8 04/02/2012   HCT 48.2 04/02/2012     Assessment & Plan:    1. Uncontrolled type 2 diabetes mellitus with complication, without long-term current use of insulin (HCC)  - Patient has currently uncontrolled symptomatic type 2 DM since  60 years of age,  with most recent A1c of 8.6 % from 9.6% from July 2016.   His labs show normal renal function. -his  diabetes is complicated by neuropathy and patient remains at a high risk for more acute and chronic complications of diabetes which include CAD, CVA, CKD, retinopathy, and neuropathy. These are all discussed in detail with the patient.  - I have counseled the patient on diet management and weight loss, by adopting a carbohydrate restricted/protein rich diet.  - Suggestion is made for patient to avoid simple carbohydrates   from their diet including Cakes , Desserts, Ice Cream,  Soda (  diet and regular) , Sweet Tea , Candies,  Chips, Cookies, Artificial Sweeteners,   and "Sugar-free" Products . This will help patient to have stable blood glucose profile and potentially avoid unintended weight gain.  - I encouraged the patient to switch to  unprocessed or minimally processed complex starch and increased protein intake (animal or plant source), fruits, and vegetables.  - Patient is advised to stick to a routine mealtimes to eat 3 meals  a day and avoid unnecessary snacks ( to snack only to correct hypoglycemia).  - The patient will be scheduled with Norm Salt, RDN, CDE for individualized DM education.  - I have approached patient with the following individualized plan to manage diabetes and patient agrees:   -First priority on this patient will be avoiding inadvertent hypoglycemia.  - He has to be on safe oral regimen.  -I will continue with metformin 1000 g by mouth twice a day and and increase Invokana to 300 mg by mouth every morning. Side effects and precaution are discussed with patient.  - Patient will be considered for incretin therapy as appropriate next visit. - Patient specific target  A1c;   LDL, HDL, Triglycerides, and  Waist Circumference were discussed in detail.  2) BP/HTN: Uncontrolled, his medication list does not include ACEI/ARB. I will consider low-dose lisinopril visit. 3) Lipids/HPL:  Controlled unknown, he is not on statins, will consider low-dose statin next visit.  4)  obesity: CDE Consult will be initiated , exercise, and  detailed carbohydrates information provided.  5) Chronic Care/Health Maintenance:  -Patient is encouraged to continue to follow up with Ophthalmology, Podiatrist at least yearly or according to recommendations, and advised to   stay away from smoking. I have recommended yearly flu vaccine and pneumonia vaccination at least every 5 years; moderate intensity exercise for up to 150 minutes weekly; and  sleep for at least 7 hours a day.  - 25 minutes of time was spent on the care of this patient , 50% of which was applied for counseling on diabetes complications and their preventions.  - Patient to bring meter and  blood glucose logs during their next visit.   - I advised patient to maintain close follow up with HAWKINS,EDWARD L, MD for primary care needs.  Follow up plan: - Return in about 3 months (around 09/14/2015) for diabetes, high blood pressure, high cholesterol, follow up with pre-visit labs.  Marquis Lunch, MD Phone: [phone]  Fax: [phone]   06/14/2015, 9:21 AM

## 2015-08-05 ENCOUNTER — Other Ambulatory Visit: Payer: Self-pay | Admitting: "Endocrinology

## 2015-09-20 ENCOUNTER — Ambulatory Visit: Payer: Medicare Other | Admitting: "Endocrinology

## 2015-09-20 ENCOUNTER — Ambulatory Visit: Payer: Medicare Other | Admitting: Nutrition

## 2015-11-04 ENCOUNTER — Emergency Department (HOSPITAL_COMMUNITY): Payer: Medicare Other

## 2015-11-04 ENCOUNTER — Encounter (HOSPITAL_COMMUNITY): Payer: Self-pay

## 2015-11-04 ENCOUNTER — Inpatient Hospital Stay (HOSPITAL_COMMUNITY)
Admission: EM | Admit: 2015-11-04 | Discharge: 2015-11-09 | DRG: 377 | Disposition: A | Discharge status: Home / Self Care | Payer: Medicare Other | Attending: Pulmonary Disease | Admitting: Pulmonary Disease

## 2015-11-04 DIAGNOSIS — K3189 Other diseases of stomach and duodenum: Secondary | ICD-10-CM | POA: Diagnosis present

## 2015-11-04 DIAGNOSIS — I10 Essential (primary) hypertension: Secondary | ICD-10-CM | POA: Diagnosis present

## 2015-11-04 DIAGNOSIS — K264 Chronic or unspecified duodenal ulcer with hemorrhage: Secondary | ICD-10-CM | POA: Diagnosis not present

## 2015-11-04 DIAGNOSIS — K2270 Barrett's esophagus without dysplasia: Secondary | ICD-10-CM | POA: Diagnosis present

## 2015-11-04 DIAGNOSIS — E1165 Type 2 diabetes mellitus with hyperglycemia: Secondary | ICD-10-CM | POA: Diagnosis present

## 2015-11-04 DIAGNOSIS — T39395A Adverse effect of other nonsteroidal anti-inflammatory drugs [NSAID], initial encounter: Secondary | ICD-10-CM | POA: Diagnosis present

## 2015-11-04 DIAGNOSIS — Z8719 Personal history of other diseases of the digestive system: Secondary | ICD-10-CM | POA: Diagnosis not present

## 2015-11-04 DIAGNOSIS — J449 Chronic obstructive pulmonary disease, unspecified: Secondary | ICD-10-CM | POA: Diagnosis present

## 2015-11-04 DIAGNOSIS — E872 Acidosis, unspecified: Secondary | ICD-10-CM | POA: Diagnosis present

## 2015-11-04 DIAGNOSIS — K2970 Gastritis, unspecified, without bleeding: Secondary | ICD-10-CM | POA: Diagnosis present

## 2015-11-04 DIAGNOSIS — G710 Muscular dystrophy: Secondary | ICD-10-CM | POA: Diagnosis present

## 2015-11-04 DIAGNOSIS — Z8249 Family history of ischemic heart disease and other diseases of the circulatory system: Secondary | ICD-10-CM

## 2015-11-04 DIAGNOSIS — Z7984 Long term (current) use of oral hypoglycemic drugs: Secondary | ICD-10-CM

## 2015-11-04 DIAGNOSIS — K269 Duodenal ulcer, unspecified as acute or chronic, without hemorrhage or perforation: Secondary | ICD-10-CM | POA: Diagnosis present

## 2015-11-04 DIAGNOSIS — K228 Other specified diseases of esophagus: Secondary | ICD-10-CM | POA: Diagnosis not present

## 2015-11-04 DIAGNOSIS — D62 Acute posthemorrhagic anemia: Secondary | ICD-10-CM | POA: Diagnosis present

## 2015-11-04 DIAGNOSIS — K449 Diaphragmatic hernia without obstruction or gangrene: Secondary | ICD-10-CM | POA: Diagnosis present

## 2015-11-04 DIAGNOSIS — K766 Portal hypertension: Secondary | ICD-10-CM | POA: Diagnosis present

## 2015-11-04 DIAGNOSIS — IMO0002 Reserved for concepts with insufficient information to code with codable children: Secondary | ICD-10-CM | POA: Diagnosis present

## 2015-11-04 DIAGNOSIS — K2980 Duodenitis without bleeding: Secondary | ICD-10-CM | POA: Diagnosis present

## 2015-11-04 DIAGNOSIS — Z791 Long term (current) use of non-steroidal anti-inflammatories (NSAID): Secondary | ICD-10-CM | POA: Diagnosis not present

## 2015-11-04 DIAGNOSIS — K625 Hemorrhage of anus and rectum: Secondary | ICD-10-CM | POA: Diagnosis present

## 2015-11-04 DIAGNOSIS — Z6841 Body Mass Index (BMI) 40.0 and over, adult: Secondary | ICD-10-CM

## 2015-11-04 DIAGNOSIS — Z8711 Personal history of peptic ulcer disease: Secondary | ICD-10-CM | POA: Diagnosis not present

## 2015-11-04 DIAGNOSIS — S31801A Laceration without foreign body of unspecified buttock, initial encounter: Secondary | ICD-10-CM | POA: Diagnosis present

## 2015-11-04 DIAGNOSIS — R7402 Elevation of levels of lactic acid dehydrogenase (LDH): Secondary | ICD-10-CM | POA: Insufficient documentation

## 2015-11-04 DIAGNOSIS — K315 Obstruction of duodenum: Secondary | ICD-10-CM | POA: Diagnosis present

## 2015-11-04 DIAGNOSIS — R571 Hypovolemic shock: Secondary | ICD-10-CM

## 2015-11-04 DIAGNOSIS — K260 Acute duodenal ulcer with hemorrhage: Principal | ICD-10-CM | POA: Diagnosis present

## 2015-11-04 DIAGNOSIS — R740 Nonspecific elevation of levels of transaminase and lactic acid dehydrogenase [LDH]: Secondary | ICD-10-CM | POA: Diagnosis not present

## 2015-11-04 DIAGNOSIS — R933 Abnormal findings on diagnostic imaging of other parts of digestive tract: Secondary | ICD-10-CM | POA: Diagnosis not present

## 2015-11-04 DIAGNOSIS — R1013 Epigastric pain: Secondary | ICD-10-CM | POA: Diagnosis not present

## 2015-11-04 LAB — HEMOGLOBIN AND HEMATOCRIT, BLOOD
HCT: 35.8 % — ABNORMAL LOW (ref 39.0–52.0)
HCT: 36.2 % — ABNORMAL LOW (ref 39.0–52.0)
HCT: 33.1 % — ABNORMAL LOW (ref 39.0–52.0)
HEMATOCRIT: 34 % — AB (ref 39.0–52.0)
HEMOGLOBIN: 11.7 g/dL — AB (ref 13.0–17.0)
HEMOGLOBIN: 10.6 g/dL — AB (ref 13.0–17.0)
Hemoglobin: 11.4 g/dL — ABNORMAL LOW (ref 13.0–17.0)
Hemoglobin: 10.8 g/dL — ABNORMAL LOW (ref 13.0–17.0)

## 2015-11-04 LAB — CBC WITH DIFFERENTIAL/PLATELET
Basophils Absolute: 0 10*3/uL (ref 0.0–0.1)
Basophils Relative: 0 %
Eosinophils Absolute: 0 10*3/uL (ref 0.0–0.7)
Eosinophils Relative: 0 %
HEMATOCRIT: 40.7 % (ref 39.0–52.0)
HEMOGLOBIN: 12.5 g/dL — AB (ref 13.0–17.0)
LYMPHS ABS: 0.8 10*3/uL (ref 0.7–4.0)
Lymphocytes Relative: 15 %
MCH: 27.5 pg (ref 26.0–34.0)
MCHC: 30.7 g/dL (ref 30.0–36.0)
MCV: 89.5 fL (ref 78.0–100.0)
MONOS PCT: 4 %
Monocytes Absolute: 0.2 10*3/uL (ref 0.1–1.0)
NEUTROS ABS: 4.1 10*3/uL (ref 1.7–7.7)
NEUTROS PCT: 81 %
Platelets: 134 10*3/uL — ABNORMAL LOW (ref 150–400)
RBC: 4.55 MIL/uL (ref 4.22–5.81)
RDW: 16.6 % — ABNORMAL HIGH (ref 11.5–15.5)
WBC: 5.1 10*3/uL (ref 4.0–10.5)

## 2015-11-04 LAB — I-STAT CG4 LACTIC ACID, ED: LACTIC ACID, VENOUS: 4.61 mmol/L — AB (ref 0.5–1.9)

## 2015-11-04 LAB — CBC
HEMATOCRIT: 37.1 % — AB (ref 39.0–52.0)
HEMOGLOBIN: 11.9 g/dL — AB (ref 13.0–17.0)
MCH: 28.7 pg (ref 26.0–34.0)
MCHC: 32.1 g/dL (ref 30.0–36.0)
MCV: 89.4 fL (ref 78.0–100.0)
Platelets: 143 10*3/uL — ABNORMAL LOW (ref 150–400)
RBC: 4.15 MIL/uL — AB (ref 4.22–5.81)
RDW: 16.7 % — ABNORMAL HIGH (ref 11.5–15.5)
WBC: 6 10*3/uL (ref 4.0–10.5)

## 2015-11-04 LAB — GLUCOSE, CAPILLARY
GLUCOSE-CAPILLARY: 232 mg/dL — AB (ref 65–99)
GLUCOSE-CAPILLARY: 164 mg/dL — AB (ref 65–99)
GLUCOSE-CAPILLARY: 131 mg/dL — AB (ref 65–99)
Glucose-Capillary: 139 mg/dL — ABNORMAL HIGH (ref 65–99)

## 2015-11-04 LAB — LIPASE, BLOOD: LIPASE: 26 U/L (ref 11–51)

## 2015-11-04 LAB — LACTIC ACID, PLASMA
LACTIC ACID, VENOUS: 3 mmol/L — AB (ref 0.5–1.9)
Lactic Acid, Venous: 4.1 mmol/L (ref 0.5–1.9)

## 2015-11-04 LAB — BASIC METABOLIC PANEL
ANION GAP: 7 (ref 5–15)
BUN: 19 mg/dL (ref 6–20)
CALCIUM: 8.9 mg/dL (ref 8.9–10.3)
CHLORIDE: 106 mmol/L (ref 101–111)
CO2: 24 mmol/L (ref 22–32)
Creatinine, Ser: 0.58 mg/dL — ABNORMAL LOW (ref 0.61–1.24)
GFR calc non Af Amer: >60 mL/min (ref >60)
Glucose, Bld: 247 mg/dL — ABNORMAL HIGH (ref 65–99)
POTASSIUM: 4.9 mmol/L (ref 3.5–5.1)
Sodium: 137 mmol/L (ref 135–145)

## 2015-11-04 LAB — COMPREHENSIVE METABOLIC PANEL
ALT: 33 U/L (ref 17–63)
ANION GAP: 7 (ref 5–15)
AST: 34 U/L (ref 15–41)
Albumin: 3.5 g/dL (ref 3.5–5.0)
Alkaline Phosphatase: 54 U/L (ref 38–126)
BUN: 17 mg/dL (ref 6–20)
CHLORIDE: 102 mmol/L (ref 101–111)
CO2: 29 mmol/L (ref 22–32)
Calcium: 9 mg/dL (ref 8.9–10.3)
Creatinine, Ser: 0.54 mg/dL — ABNORMAL LOW (ref 0.61–1.24)
GFR calc non Af Amer: >60 mL/min (ref >60)
Glucose, Bld: 217 mg/dL — ABNORMAL HIGH (ref 65–99)
POTASSIUM: 4.4 mmol/L (ref 3.5–5.1)
SODIUM: 138 mmol/L (ref 135–145)
Total Bilirubin: 1 mg/dL (ref 0.3–1.2)
Total Protein: 7.5 g/dL (ref 6.5–8.1)

## 2015-11-04 LAB — I-STAT CHEM 8, ED
BUN: 16 mg/dL (ref 6–20)
CHLORIDE: 99 mmol/L — AB (ref 101–111)
Calcium, Ion: 1.16 mmol/L (ref 1.13–1.30)
Creatinine, Ser: 0.5 mg/dL — ABNORMAL LOW (ref 0.61–1.24)
GLUCOSE: 201 mg/dL — AB (ref 65–99)
HCT: 43 % (ref 39.0–52.0)
HEMOGLOBIN: 14.6 g/dL (ref 13.0–17.0)
POTASSIUM: 4.4 mmol/L (ref 3.5–5.1)
SODIUM: 141 mmol/L (ref 135–145)
TCO2: 28 mmol/L (ref 0–100)

## 2015-11-04 LAB — POC OCCULT BLOOD, ED: Fecal Occult Bld: POSITIVE — AB

## 2015-11-04 LAB — PROTIME-INR
INR: 1.28
PROTHROMBIN TIME: 16.1 s — AB (ref 11.4–15.2)

## 2015-11-04 LAB — TYPE AND SCREEN
ABO/RH(D): A POS
ANTIBODY SCREEN: NEGATIVE

## 2015-11-04 MED ORDER — PANTOPRAZOLE SODIUM 40 MG IV SOLR
INTRAVENOUS | Status: AC
  Filled 2015-11-04: qty 160

## 2015-11-04 MED ORDER — CIPROFLOXACIN IN D5W 400 MG/200ML IV SOLN
400.0000 mg | Freq: Once | INTRAVENOUS | Status: AC
  Administered 2015-11-04: 400 mg via INTRAVENOUS
  Filled 2015-11-04: qty 200

## 2015-11-04 MED ORDER — SODIUM CHLORIDE 0.9 % IV SOLN
INTRAVENOUS | Status: AC
  Administered 2015-11-04: 500 mL via INTRAVENOUS
  Administered 2015-11-04: 1000 mL via INTRAVENOUS

## 2015-11-04 MED ORDER — LORAZEPAM 2 MG/ML IJ SOLN
0.5000 mg | Freq: Once | INTRAMUSCULAR | Status: AC
  Administered 2015-11-04: 0.5 mg via INTRAVENOUS
  Filled 2015-11-04: qty 1

## 2015-11-04 MED ORDER — SODIUM CHLORIDE 0.9 % IV BOLUS (SEPSIS): 1000.0000 mL | Freq: Once | INTRAVENOUS | Status: AC

## 2015-11-04 MED ORDER — SODIUM CHLORIDE 0.9 % IV SOLN
INTRAVENOUS | Status: DC
  Administered 2015-11-04: 03:00:00 via INTRAVENOUS

## 2015-11-04 MED ORDER — SODIUM CHLORIDE 0.9 % IV SOLN: Freq: Once | INTRAVENOUS | Status: DC

## 2015-11-04 MED ORDER — ONDANSETRON HCL 4 MG PO TABS
4.0000 mg | ORAL_TABLET | Freq: Four times a day (QID) | ORAL | Status: DC | PRN
  Administered 2015-11-07: 4 mg via ORAL
  Filled 2015-11-04: qty 1

## 2015-11-04 MED ORDER — SODIUM CHLORIDE 0.9 % IV SOLN
80.0000 mg | Freq: Once | INTRAVENOUS | Status: AC
  Filled 2015-11-04: qty 80

## 2015-11-04 MED ORDER — INSULIN ASPART 100 UNIT/ML ~~LOC~~ SOLN
0.0000 [IU] | SUBCUTANEOUS | Status: DC
  Administered 2015-11-04: 2 [IU] via SUBCUTANEOUS
  Administered 2015-11-04: 1 [IU] via SUBCUTANEOUS
  Administered 2015-11-04: 3 [IU] via SUBCUTANEOUS
  Administered 2015-11-04: 1 [IU] via SUBCUTANEOUS
  Administered 2015-11-05: 2 [IU] via SUBCUTANEOUS
  Administered 2015-11-05: 1 [IU] via SUBCUTANEOUS
  Administered 2015-11-05: 2 [IU] via SUBCUTANEOUS
  Administered 2015-11-05: 3 [IU] via SUBCUTANEOUS
  Administered 2015-11-05: 1 [IU] via SUBCUTANEOUS
  Administered 2015-11-06 (×2): 3 [IU] via SUBCUTANEOUS
  Administered 2015-11-06: 1 [IU] via SUBCUTANEOUS

## 2015-11-04 MED ORDER — SODIUM CHLORIDE 0.9 % IV BOLUS (SEPSIS)
1000.0000 mL | Freq: Once | INTRAVENOUS | Status: AC
  Administered 2015-11-04: 1000 mL via INTRAVENOUS

## 2015-11-04 MED ORDER — METRONIDAZOLE IN NACL 5-0.79 MG/ML-% IV SOLN
500.0000 mg | Freq: Three times a day (TID) | INTRAVENOUS | Status: DC
  Administered 2015-11-04 – 2015-11-07 (×9): 500 mg via INTRAVENOUS
  Filled 2015-11-04 (×9): qty 100

## 2015-11-04 MED ORDER — SODIUM CHLORIDE 0.9 % IV SOLN
8.0000 mg/h | INTRAVENOUS | Status: DC
  Administered 2015-11-04: 8 mg/h via INTRAVENOUS
  Filled 2015-11-04 (×2): qty 80

## 2015-11-04 MED ORDER — METRONIDAZOLE IN NACL 5-0.79 MG/ML-% IV SOLN
500.0000 mg | Freq: Once | INTRAVENOUS | Status: AC
  Administered 2015-11-04: 500 mg via INTRAVENOUS
  Filled 2015-11-04: qty 100

## 2015-11-04 MED ORDER — CIPROFLOXACIN IN D5W 400 MG/200ML IV SOLN
400.0000 mg | Freq: Two times a day (BID) | INTRAVENOUS | Status: DC
  Administered 2015-11-04 – 2015-11-07 (×6): 400 mg via INTRAVENOUS
  Filled 2015-11-04 (×6): qty 200

## 2015-11-04 MED ORDER — ONDANSETRON HCL 4 MG/2ML IJ SOLN
4.0000 mg | Freq: Once | INTRAMUSCULAR | Status: AC
  Administered 2015-11-04: 4 mg via INTRAVENOUS
  Filled 2015-11-04: qty 2

## 2015-11-04 MED ORDER — SODIUM CHLORIDE 0.9 % IV SOLN
8.0000 mg/h | INTRAVENOUS | Status: DC
  Administered 2015-11-04 – 2015-11-06 (×5): 8 mg/h via INTRAVENOUS
  Filled 2015-11-04 (×10): qty 80

## 2015-11-04 MED ORDER — SODIUM CHLORIDE 0.9 % IV SOLN
80.0000 mg | Freq: Once | INTRAVENOUS | Status: AC
  Administered 2015-11-04: 80 mg via INTRAVENOUS
  Filled 2015-11-04: qty 80

## 2015-11-04 MED ORDER — DIATRIZOATE MEGLUMINE & SODIUM 66-10 % PO SOLN
ORAL | Status: AC
  Filled 2015-11-04: qty 30

## 2015-11-04 MED ORDER — ONDANSETRON HCL 4 MG/2ML IJ SOLN
4.0000 mg | Freq: Four times a day (QID) | INTRAMUSCULAR | Status: DC | PRN
  Administered 2015-11-05 – 2015-11-08 (×6): 4 mg via INTRAVENOUS
  Filled 2015-11-04 (×6): qty 2

## 2015-11-04 MED ORDER — IOPAMIDOL (ISOVUE-300) INJECTION 61%
100.0000 mL | Freq: Once | INTRAVENOUS | Status: AC | PRN
  Administered 2015-11-04: 100 mL via INTRAVENOUS

## 2015-11-04 MED ORDER — SODIUM CHLORIDE 0.9 % IV SOLN: INTRAVENOUS | Status: DC

## 2015-11-04 NOTE — ED Notes (Signed)
Pt back Xray

## 2015-11-04 NOTE — Progress Notes (Signed)
Paged Dr. Darrick Penna gastrology, phone called received and page acknowledged.

## 2015-11-04 NOTE — Progress Notes (Signed)
Subjective: He was admitted early this morning with abdominal pain. He had maroon stools. He has previous history of peptic ulcer disease and CT this morning shows acute duodenal ulcer without perforation. He is on IV Protonix drip. He has been tachycardic and his lactic acid level has been elevated. He still looks uncomfortable.  Objective: Vital signs in last 24 hours: Temp:  [97.4 F (36.3 C)-99.1 F (37.3 C)] 98.6 F (37 C) (08/03 0736) Pulse Rate:  [105-128] 122 (08/03 0732) Resp:  [15-28] 23 (08/03 0732) BP: (109-144)/(60-103) 109/61 (08/03 0732) SpO2:  [88 %-95 %] 95 % (08/03 0732) FiO2 (%):  [32 %] 32 % (08/03 0732) Weight:  [109.4 kg (241 lb 2.9 oz)-113.4 kg (250 lb)] 109.4 kg (241 lb 2.9 oz) (08/03 0630) Weight change:  Last BM Date: 11/04/15  Intake/Output from previous day: No intake/output data recorded.  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  Lab Results:  Results for orders placed or performed during the hospital encounter of 11/04/15 (from the past 48 hour(s))  POC occult blood, ED     Status: Abnormal   Collection Time: 11/04/15  1:52 AM  Result Value Ref Range   Fecal Occult Bld POSITIVE (A) NEGATIVE  Comprehensive metabolic panel     Status: Abnormal   Collection Time: 11/04/15  2:17 AM  Result Value Ref Range   Sodium 138 135 - 145 mmol/L   Potassium 4.4 3.5 - 5.1 mmol/L   Chloride 102 101 - 111 mmol/L   CO2 29 22 - 32 mmol/L   Glucose, Bld 217 (H) 65 - 99 mg/dL   BUN 17 6 - 20 mg/dL   Creatinine, Ser 0.54 (L) 0.61 - 1.24 mg/dL   Calcium 9.0 8.9 - 10.3 mg/dL   Total Protein 7.5 6.5 - 8.1 g/dL   Albumin 3.5 3.5 - 5.0 g/dL   AST 34 15 - 41 U/L   ALT 33 17 - 63 U/L   Alkaline Phosphatase 54 38 - 126 U/L    Total Bilirubin 1.0 0.3 - 1.2 mg/dL   GFR calc non Af Amer >60 >60 mL/min   GFR calc Af Amer >60 >60 mL/min    Comment: (NOTE) The eGFR has been calculated using the CKD EPI equation. This calculation has not been validated in all clinical situations. eGFR's persistently <60 mL/min signify possible Chronic Kidney Disease.    Anion gap 7 5 - 15  CBC WITH DIFFERENTIAL     Status: Abnormal   Collection Time: 11/04/15  2:17 AM  Result Value Ref Range   WBC 5.1 4.0 - 10.5 K/uL   RBC 4.55 4.22 - 5.81 MIL/uL   Hemoglobin 12.5 (L) 13.0 - 17.0 g/dL   HCT 40.7 39.0 - 52.0 %   MCV 89.5 78.0 - 100.0 fL   MCH 27.5 26.0 - 34.0 pg   MCHC 30.7 30.0 - 36.0 g/dL   RDW 16.6 (H) 11.5 - 15.5 %   Platelets 134 (L) 150 - 400 K/uL   Neutrophils Relative % 81 %   Neutro Abs 4.1 1.7 - 7.7 K/uL   Lymphocytes Relative 15 %   Lymphs Abs 0.8 0.7 - 4.0 K/uL   Monocytes Relative 4 %   Monocytes Absolute 0.2 0.1 - 1.0 K/uL   Eosinophils Relative 0 %   Eosinophils Absolute 0.0 0.0 -  0.7 K/uL   Basophils Relative 0 %   Basophils Absolute 0.0 0.0 - 0.1 K/uL  Lipase, blood     Status: None   Collection Time: 11/04/15  2:17 AM  Result Value Ref Range   Lipase 26 11 - 51 U/L  Protime-INR     Status: Abnormal   Collection Time: 11/04/15  2:17 AM  Result Value Ref Range   Prothrombin Time 16.1 (H) 11.4 - 15.2 seconds   INR 1.28   Type and screen [facility]     Status: None   Collection Time: 11/04/15  2:17 AM  Result Value Ref Range   ABO/RH(D) A POS    Antibody Screen NEG    Sample Expiration 11/07/2015   I-Stat Chem 8, ED  (not at [facility])     Status: Abnormal   Collection Time: 11/04/15  2:27 AM  Result Value Ref Range   Sodium 141 135 - 145 mmol/L   Potassium 4.4 3.5 - 5.1 mmol/L   Chloride 99 (L) 101 - 111 mmol/L   BUN 16 6 - 20 mg/dL   Creatinine, Ser 0.50 (L) 0.61 - 1.24 mg/dL   Glucose, Bld 201 (H) 65 - 99 mg/dL   Calcium, Ion 1.16 1.13 - 1.30 mmol/L   TCO2 28 0 - 100 mmol/L    Hemoglobin 14.6 13.0 - 17.0 g/dL   HCT 43.0 39.0 - 52.0 %  I-Stat CG4 Lactic Acid, ED  (not at [facility])     Status: Abnormal   Collection Time: 11/04/15  2:28 AM  Result Value Ref Range   Lactic Acid, Venous 4.61 (HH) 0.5 - 1.9 mmol/L   Comment NOTIFIED PHYSICIAN   CBC     Status: Abnormal   Collection Time: 11/04/15  7:01 AM  Result Value Ref Range   WBC 6.0 4.0 - 10.5 K/uL   RBC 4.15 (L) 4.22 - 5.81 MIL/uL   Hemoglobin 11.9 (L) 13.0 - 17.0 g/dL   HCT 37.1 (L) 39.0 - 52.0 %   MCV 89.4 78.0 - 100.0 fL   MCH 28.7 26.0 - 34.0 pg   MCHC 32.1 30.0 - 36.0 g/dL   RDW 16.7 (H) 11.5 - 15.5 %   Platelets 143 (L) 150 - 400 K/uL  Basic metabolic panel     Status: Abnormal   Collection Time: 11/04/15  7:01 AM  Result Value Ref Range   Sodium 137 135 - 145 mmol/L   Potassium 4.9 3.5 - 5.1 mmol/L   Chloride 106 101 - 111 mmol/L   CO2 24 22 - 32 mmol/L   Glucose, Bld 247 (H) 65 - 99 mg/dL   BUN 19 6 - 20 mg/dL   Creatinine, Ser 0.58 (L) 0.61 - 1.24 mg/dL   Calcium 8.9 8.9 - 10.3 mg/dL   GFR calc non Af Amer >60 >60 mL/min   GFR calc Af Amer >60 >60 mL/min    Comment: (NOTE) The eGFR has been calculated using the CKD EPI equation. This calculation has not been validated in all clinical situations. eGFR's persistently <60 mL/min signify possible Chronic Kidney Disease.    Anion gap 7 5 - 15  Lactic acid, plasma     Status: Abnormal   Collection Time: 11/04/15  7:01 AM  Result Value Ref Range   Lactic Acid, Venous 4.1 (HH) 0.5 - 1.9 mmol/L    Comment: CRITICAL RESULT CALLED TO, READ BACK BY AND VERIFIED WITH: MCDANIEL,M AT 7:30AM ON 11/04/15 BY FESTERMAN,C   Glucose, capillary  Status: Abnormal   Collection Time: 11/04/15  7:24 AM  Result Value Ref Range   Glucose-Capillary 232 (H) 65 - 99 mg/dL   Comment 1 Notify RN    Comment 2 Document in Chart     ABGS  Recent Labs  11/04/15 0227  TCO2 28   CULTURES No results found for this or any previous visit (from the past 240  hour(s)). Studies/Results: Ct Abdomen Pelvis W Contrast  Result Date: 11/04/2015 CLINICAL DATA:  Lower abdominal crampy pain for 2 days, hematochezia at tonight. Poor historian. History of diabetes, hypertension. EXAM: CT ABDOMEN AND PELVIS WITH CONTRAST TECHNIQUE: Multidetector CT imaging of the abdomen and pelvis was performed using the standard protocol following bolus administration of intravenous contrast. CONTRAST:  147m ISOVUE-300 IOPAMIDOL (ISOVUE-300) INJECTION 61% COMPARISON:  None. FINDINGS: Altered anatomy as patient was scanned on LEFT side. LUNG BASES: Lingular and bibasilar atelectasis. Heart size is normal. No pericardial effusions. SOLID ORGANS: The liver, spleen, pancreas and adrenal glands are unremarkable. Dense layering gallbladder sludge versus tiny stones, no CT findings of acute cholecystitis. GASTROINTESTINAL TRACT: Focally thickened, patulous duodenum with focal intramural gas and mild paraduodenal fat stranding. The stomach, large bowel are normal in course and caliber without inflammatory changes. Moderate descending and proximal sigmoid diverticulosis. Status post appendectomy. KIDNEYS/ URINARY TRACT: Kidneys are orthotopic, demonstrating symmetric enhancement. 3 mm RIGHT upper pole, 2 mm RIGHT upper pole, punctate RIGHT upper pole, 2 mm RIGHT interpolar and 5 mm RIGHT lower pole nephrolithiasis. 2 mm LEFT lower pole, punctate LEFT lower pole nephrolithiasis. No hydronephrosis or solid renal masses. The unopacified ureters are normal in course and caliber. Delayed imaging through the kidneys demonstrates symmetric prompt contrast excretion within the proximal urinary collecting system. Urinary bladder is well distended and unremarkable. PERITONEUM/RETROPERITONEUM: Aortoiliac vessels are normal in course and caliber, mild calcific atherosclerosis. No lymphadenopathy by CT size criteria. Mild prostatomegaly. RIGHT pelvic surgical clips. No intraperitoneal free fluid nor free air. SOFT  TISSUE/OSSEOUS STRUCTURES: Non-suspicious. Small fat containing LEFT inguinal hernia. Ankylosis of the sacroiliac joints. Mild lumbar levoscoliosis. Mild degenerative change of the thoracolumbar spine. IMPRESSION: Findings of acute duodenum ulcer, no definite perforation and no abscess. Recommend endoscopy. Colonic diverticulosis.  Cholelithiasis. Nonobstructing bilateral nephrolithiasis. These results will be called to the ordering clinician or representative by the Radiologist Assistant, and communication documented in the PACS or zVision Dashboard. Electronically Signed   By: CElon AlasM.D.   On: 11/04/2015 05:35    Medications:  Prior to Admission:  Prescriptions Prior to Admission  Medication Sig Dispense Refill Last Dose  . albuterol (PROVENTIL HFA;VENTOLIN HFA) 108 (90 BASE) MCG/ACT inhaler Inhale 2 puffs into the lungs every 6 (six) hours as needed. Shortness of Breath   Taking  . Blood Glucose Monitoring Suppl (ACCU-CHEK AVIVA) device Use as instructed 1 each 0 Taking  . canagliflozin (INVOKANA) 100 MG TABS tablet Take 3 tablets (300 mg total) by mouth daily before breakfast. 90 tablet 1   . colchicine 0.6 MG tablet Take 0.6 mg by mouth daily.   Taking  . furosemide (LASIX) 40 MG tablet Take 40 mg by mouth.   Taking  . glucose blood (ACCU-CHEK AVIVA) test strip Use to test glucose 4 times a day. 150 each 3 Taking  . lisinopril-hydrochlorothiazide (PRINZIDE,ZESTORETIC) 10-12.5 MG tablet TAKE ONE TABLET BY MOUTH ONCE DAILY. 90 tablet 0   . meclizine (ANTIVERT) 25 MG tablet Take 25 mg by mouth 3 (three) times daily as needed for dizziness.     .Marland Kitchen  metFORMIN (GLUCOPHAGE) 1000 MG tablet Take 1 tablet (1,000 mg total) by mouth 2 (two) times daily with a meal. 180 tablet 3   . omeprazole (PRILOSEC) 20 MG capsule Take 20 mg by mouth daily.     Taking  . oxyCODONE-acetaminophen (PERCOCET/ROXICET) 5-325 MG per tablet 1 or 2 tabs PO q6h prn pain 25 tablet 0 Taking  . tamsulosin (FLOMAX) 0.4 MG  CAPS capsule Take 0.4 mg by mouth.   Taking   Scheduled: . sodium chloride   Intravenous Once  . ciprofloxacin  400 mg Intravenous Q12H  . diatrizoate meglumine-sodium      . insulin aspart  0-9 Units Subcutaneous Q4H  . metronidazole  500 mg Intravenous Q8H  . pantoprazole (PROTONIX) IVPB  80 mg Intravenous Once   Continuous: . sodium chloride 500 mL (11/04/15 0816)  . pantoprozole (PROTONIX) infusion 8 mg/hr (11/04/15 0813)   EAK:[id number] **OR** ondansetron (ZOFRAN) IV  Assesment:He has acute duodenal ulcer. He is bleeding. His hemoglobin level has dropped. His tachycardia is better but not resolved. Lactic level is still elevated. Principal Problem:   Rectal bleeding Active Problems:   Type II diabetes mellitus, uncontrolled ([place])   Essential hypertension, benign   Morbid obesity due to excess calories (HCC)   Lactic acidosis    Plan: Continue volume resuscitation. He is likely to need blood. GI consultation. Protonix drip.    LOS: 0 days   Efrata Brunner L 11/04/2015, 8:37 AM

## 2015-11-04 NOTE — ED Provider Notes (Signed)
[facility]-EMERGENCY DEPT Provider Note   CSN: [id number] Arrival date & time: 11/04/15  0121  First Provider Contact:  First MD Initiated Contact with Patient 11/04/15 0128        History   Chief Complaint Chief Complaint  Patient presents with  . GI Bleeding    HPI Dwayne Davies is a 60 y.o. male.  Patient presents by EMS with rectal bleeding onset tonight. Patient is a poor historian. States she was on the commode having a bowel movement when he noticed dark blood in the toilet bowl mixed with stool. EMS states it was 50-100 mL. He had multiple episodes of vomiting but he says this was clear and not bloody or dark. She's never happened before. Reports no further episodes of bleeding since. He is not sure is ever had a colonoscopy. He denies taking any blood thinners. Denies any chest pain. He does have some dizziness and lower abdominal pain that is constant.   The history is provided by the patient and the EMS personnel.    Past Medical History:  Diagnosis Date  . Anxiety   . Chest pain   . Chronic back pain   . Chronic pain in left shoulder   . COPD (chronic obstructive pulmonary disease) (HCC)   . Diabetes mellitus   . Elevated lipids   . GERD (gastroesophageal reflux disease)   . Gout   . Gout   . Hypertension   . Normal cardiac stress test 2000, 2005  . Shortness of breath     Patient Active Problem List   Diagnosis Date Noted  . Essential hypertension, benign 04/02/2015  . Morbid obesity due to excess calories (HCC) 04/02/2015  . Type II diabetes mellitus, uncontrolled (HCC) 12/15/2009  . DYSPNEA 12/15/2009  . CHEST PAIN 12/15/2009    Past Surgical History:  Procedure Laterality Date  . CARDIAC CATHETERIZATION     x2 (last 2001), both normal coronary arteries  . CATARACT EXTRACTION W/PHACO  04/08/2012   Procedure: CATARACT EXTRACTION PHACO AND INTRAOCULAR LENS PLACEMENT (IOC);  Surgeon: Susa Simmonds, MD;  Location: [facility];  Service: Ophthalmology;   Laterality: Right;  CDE:40.75       Home Medications    Prior to Admission medications   Medication Sig Start Date End Date Taking? Authorizing Provider  albuterol (PROVENTIL HFA;VENTOLIN HFA) 108 (90 BASE) MCG/ACT inhaler Inhale 2 puffs into the lungs every 6 (six) hours as needed. Shortness of Breath    Historical Provider, MD  Blood Glucose Monitoring Suppl (ACCU-CHEK AVIVA) device Use as instructed 03/10/15   Roma Kayser, MD  canagliflozin [facility]) 100 MG TABS tablet Take 3 tablets (300 mg total) by mouth daily before breakfast. 06/14/15   Roma Kayser, MD  colchicine 0.6 MG tablet Take 0.6 mg by mouth daily.    Historical Provider, MD  furosemide (LASIX) 40 MG tablet Take 40 mg by mouth.    Historical Provider, MD  glucose blood (ACCU-CHEK AVIVA) test strip Use to test glucose 4 times a day. 03/10/15   Roma Kayser, MD  lisinopril-hydrochlorothiazide (PRINZIDE,ZESTORETIC) 10-12.5 MG tablet TAKE ONE TABLET BY MOUTH ONCE DAILY. 08/05/15   Roma Kayser, MD  meclizine (ANTIVERT) 25 MG tablet Take 25 mg by mouth 3 (three) times daily as needed for dizziness.    Historical Provider, MD  metFORMIN (GLUCOPHAGE) 1000 MG tablet Take 1 tablet (1,000 mg total) by mouth 2 (two) times daily with a meal. 06/14/15   Roma Kayser, MD  omeprazole (  PRILOSEC) 20 MG capsule Take 20 mg by mouth daily.      Historical Provider, MD  oxyCODONE-acetaminophen (PERCOCET/ROXICET) 5-325 MG per tablet 1 or 2 tabs PO q6h prn pain 09/07/13   Samuel Jester, DO  tamsulosin (FLOMAX) 0.4 MG CAPS capsule Take 0.4 mg by mouth.    Historical Provider, MD    Family History Family History  Problem Relation Age of Onset  . CAD Father   . Coronary artery disease Neg Hx     Social History Social History  Substance Use Topics  . Smoking status: Never Smoker  . Smokeless tobacco: Never Used  . Alcohol use No     Allergies   Review of patient's allergies indicates no known  allergies.   Review of Systems Review of Systems  Constitutional: Negative for activity change, appetite change, fatigue and fever.  HENT: Negative for congestion and rhinorrhea.   Respiratory: Negative for cough, chest tightness and shortness of breath.   Cardiovascular: Negative for chest pain.  Gastrointestinal: Positive for abdominal pain, blood in stool, nausea, rectal pain and vomiting.  Genitourinary: Negative for dysuria, hematuria and urgency.  Musculoskeletal: Negative for arthralgias and myalgias.  Skin: Negative for wound.  Neurological: Negative for dizziness, weakness, light-headedness and headaches.   A complete 10 system review of systems was obtained and all systems are negative except as noted in the HPI and PMH.    Physical Exam Updated Vital Signs BP 125/60 (BP Location: Right Arm)   Pulse 105   Temp 97.9 F (36.6 C) (Oral)   Resp 26   Ht 5\' 4"  (1.626 m)   Wt 250 lb (113.4 kg)   SpO2 94%   BMI 42.91 kg/m   Physical Exam  Constitutional: He is oriented to person, place, and time. He appears well-developed and well-nourished. No distress.  HENT:  Head: Normocephalic and atraumatic.  Mouth/Throat: Oropharynx is clear and moist. No oropharyngeal exudate.  Eyes: Conjunctivae and EOM are normal. Pupils are equal, round, and reactive to light.  Neck: Normal range of motion. Neck supple.  No meningismus.  Cardiovascular: Normal rate, regular rhythm, normal heart sounds and intact distal pulses.   No murmur heard. Tachycardic 100s  Pulmonary/Chest: Effort normal and breath sounds normal. No respiratory distress.  Abdominal: Soft. There is no tenderness. There is no rebound and no guarding.  Obese TTP LLQ, no guarding or rebound  Genitourinary:  Genitourinary Comments: Maroon stool on exam No hemorrhoids  Musculoskeletal: Normal range of motion. He exhibits no edema or tenderness.  Neurological: He is alert and oriented to person, place, and time. No  cranial nerve deficit. He exhibits normal muscle tone. Coordination normal.  No ataxia on finger to nose bilaterally. No pronator drift. 5/5 strength throughout. CN 2-12 intact.Equal grip strength. Sensation intact.   Skin: Skin is warm.  Psychiatric: He has a normal mood and affect. His behavior is normal.  Nursing note and vitals reviewed.    ED Treatments / Results  Labs (all labs ordered are listed, but only abnormal results are displayed) Labs Reviewed  COMPREHENSIVE METABOLIC PANEL - Abnormal; Notable for the following:       Result Value   Glucose, Bld 217 (*)    Creatinine, Ser 0.54 (*)    All other components within normal limits  CBC WITH DIFFERENTIAL/PLATELET - Abnormal; Notable for the following:    Hemoglobin 12.5 (*)    RDW 16.6 (*)    Platelets 134 (*)    All other components within normal  limits  PROTIME-INR - Abnormal; Notable for the following:    Prothrombin Time 16.1 (*)    All other components within normal limits  CBC - Abnormal; Notable for the following:    RBC 4.15 (*)    Hemoglobin 11.9 (*)    HCT 37.1 (*)    RDW 16.7 (*)    Platelets 143 (*)    All other components within normal limits  BASIC METABOLIC PANEL - Abnormal; Notable for the following:    Glucose, Bld 247 (*)    Creatinine, Ser 0.58 (*)    All other components within normal limits  LACTIC ACID, PLASMA - Abnormal; Notable for the following:    Lactic Acid, Venous 4.1 (*)    All other components within normal limits  GLUCOSE, CAPILLARY - Abnormal; Notable for the following:    Glucose-Capillary 232 (*)    All other components within normal limits  HEMOGLOBIN AND HEMATOCRIT, BLOOD - Abnormal; Notable for the following:    Hemoglobin 11.7 (*)    HCT 35.8 (*)    All other components within normal limits  I-STAT CHEM 8, ED - Abnormal; Notable for the following:    Chloride 99 (*)    Creatinine, Ser 0.50 (*)    Glucose, Bld 201 (*)    All other components within normal limits  POC  OCCULT BLOOD, ED - Abnormal; Notable for the following:    Fecal Occult Bld POSITIVE (*)    All other components within normal limits  I-STAT CG4 LACTIC ACID, ED - Abnormal; Notable for the following:    Lactic Acid, Venous 4.61 (*)    All other components within normal limits  MRSA PCR SCREENING  LIPASE, BLOOD  LACTIC ACID, PLASMA  HEMOGLOBIN AND HEMATOCRIT, BLOOD  HEMOGLOBIN AND HEMATOCRIT, BLOOD  HEMOGLOBIN AND HEMATOCRIT, BLOOD  TYPE AND SCREEN    EKG  EKG Interpretation  Date/Time:  Thursday November 04 2015 01:31:21 EDT Ventricular Rate:  104 PR Interval:    QRS Duration: 83 QT Interval:  317 QTC Calculation: 417 R Axis:   87 Text Interpretation:  Sinus tachycardia Borderline T wave abnormalities No significant change was found Confirmed by Manus Gunning  MD, Kellsey Sansone ([id number]) on 11/04/2015 1:38:41 AM       Radiology No results found.  Procedures Procedures (including critical care time)  Medications Ordered in ED Medications  pantoprazole (PROTONIX) 80 mg in sodium chloride 0.9 % 100 mL IVPB (not administered)  pantoprazole (PROTONIX) 80 mg in sodium chloride 0.9 % 250 mL (0.32 mg/mL) infusion (not administered)  ondansetron (ZOFRAN) injection 4 mg (not administered)  sodium chloride 0.9 % bolus 1,000 mL (not administered)    And  0.9 %  sodium chloride infusion (not administered)     Initial Impression / Assessment and Plan / ED Course  I have reviewed the triage vital signs and the nursing notes.  Pertinent labs & imaging results that were available during my care of the patient were reviewed by me and considered in my medical decision making (see chart for details).  Clinical Course  Comment By Time  Hemoglobin 12.5. INR normal. Hemoccult is positive. Glynn Octave, MD 08/03 0235  Lactate 4.6. Hemoglobin 12.5. INR is normal. Concern for possible ischemic colitis with rectal bleeding. CT scan will be obtained. Glynn Octave, MD 08/03 [phone]   Rectal bleeding  with maroon stool onset tonight. Lower abdominal pain with dizziness and lightheadedness. No blood thinner use.  Lactate 4.5. Hemoglobin 12, was 16 3 years ago. Lower  abdominal pain without peritoneal signs. Concern for possible mesenteric ischemia. Patient continued on IV fluids and IV Protonix.  CT scan suspicious for duodenal ulcer without perforation.  Patient remains tachycardic but stable blood pressure in the ED. He is awake and alert. Admission to ICU discussed with Dr. Onalee Hua.  Discussed with Dr. Karilyn Cota of GI who will consult.  CRITICAL CARE Performed by: Glynn Octave Total critical care time: 60 minutes Critical care time was exclusive of separately billable procedures and treating other patients. Critical care was necessary to treat or prevent imminent or life-threatening deterioration. Critical care was time spent personally by me on the following activities: development of treatment plan with patient and/or surrogate as well as nursing, discussions with consultants, evaluation of patient's response to treatment, examination of patient, obtaining history from patient or surrogate, ordering and performing treatments and interventions, ordering and review of laboratory studies, ordering and review of radiographic studies, pulse oximetry and re-evaluation of patient's condition.   Final Clinical Impressions(s) / ED Diagnoses   Final diagnoses:  Rectal bleeding  Elevated serum lactate dehydrogenase    New Prescriptions New Prescriptions   No medications on file     Glynn Octave, MD 11/04/15 [phone]

## 2015-11-04 NOTE — Consult Note (Signed)
Reason for Consult:Rectal bleeding Referring Physician:  [name] is an 60 y.o. male.  HPI: Admitted thru th ED this morning with rectal bleeding. Rectal bleeding started around 930am yesterday. He describes the blood as maroon colored. He says he may have bled a cup full. Denies any further rectal bleeding. Describes as abdominal burning. Marland Kitchen He says he has had weight loss of about 20 pounds over the past week.    06/14/15 241 lb (109.317 kg)  05/03/15 243 lb (110.224 kg)  04/02/15 249 lb (112.946 kg)       He says he did not feel like eating.  No vomiting.  Denies taking NSAIDS.  He does not think he has every had a colonoscopy. I could not find one in Epic. States he had a fever yesterday. Denies any melena.  Underwent a CT which revealed a duodenal ulcer. No etoh in 3 years. Takes Oxycodone twice a day for shoulder pain.    08/02/2004  Esophagogastroduodenoscopy with esophageal dilation.   INDICATION:  Dwayne Davies is a 60 year old Caucasian male with chronic GERD who  also has intermittent solid food dysphagia. Since he has been on double dose  PPI, he has noted significant improvement but still has regurgitation in  addition the dysphagia. He has history of ulcerative esophagitis and  esophageal stricture.   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 Stricture dilated to 56-French with  Venia Minks. Past Medical History:  Diagnosis Date  . Anxiety   . Chest pain   . Chronic back pain   . Chronic pain in left shoulder   . COPD (chronic obstructive pulmonary disease) ([place])   . Diabetes mellitus   . Elevated lipids   . GERD (gastroesophageal reflux disease)   . Gout   . Gout   . Hypertension   . Normal cardiac stress test 2000, 2005  . Shortness of breath     Past Surgical History:  Procedure Laterality Date  . CARDIAC CATHETERIZATION     x2 (last 2001), both normal coronary arteries  . CATARACT  EXTRACTION W/PHACO  04/08/2012   Procedure: CATARACT EXTRACTION PHACO AND INTRAOCULAR LENS PLACEMENT (IOC);  Surgeon: Williams Che, MD;  Location: [facility];  Service: Ophthalmology;  Laterality: Right;  CDE:40.75    Family History  Problem Relation Age of Onset  . CAD Father   . Coronary artery disease Neg Hx     Social History:  reports that he has never smoked. He has never used smokeless tobacco. He reports that he does not drink alcohol or use drugs.  Allergies: No Known Allergies  Medications: I have reviewed the patient's current medications.  Results for orders placed or performed during the hospital encounter of 11/04/15 (from the past 48 hour(s))  POC occult blood, ED     Status: Abnormal   Collection Time: 11/04/15  1:52 AM  Result Value Ref Range   Fecal Occult Bld POSITIVE (A) NEGATIVE  Comprehensive metabolic panel     Status: Abnormal   Collection Time: 11/04/15  2:17 AM  Result Value Ref Range   Sodium 138 135 - 145 mmol/L   Potassium 4.4 3.5 - 5.1 mmol/L   Chloride 102 101 - 111 mmol/L   CO2 29 22 - 32 mmol/L   Glucose, Bld 217 (H) 65 - 99 mg/dL   BUN 17 6 - 20 mg/dL   Creatinine, Ser 0.54 (L) 0.61 - 1.24 mg/dL   Calcium 9.0 8.9 -  10.3 mg/dL   Total Protein 7.5 6.5 - 8.1 g/dL   Albumin 3.5 3.5 - 5.0 g/dL   AST 34 15 - 41 U/L   ALT 33 17 - 63 U/L   Alkaline Phosphatase 54 38 - 126 U/L   Total Bilirubin 1.0 0.3 - 1.2 mg/dL   GFR calc non Af Amer >60 >60 mL/min   GFR calc Af Amer >60 >60 mL/min    Comment: (NOTE) The eGFR has been calculated using the CKD EPI equation. This calculation has not been validated in all clinical situations. eGFR's persistently <60 mL/min signify possible Chronic Kidney Disease.    Anion gap 7 5 - 15  CBC WITH DIFFERENTIAL     Status: Abnormal   Collection Time: 11/04/15  2:17 AM  Result Value Ref Range   WBC 5.1 4.0 - 10.5 K/uL   RBC 4.55 4.22 - 5.81 MIL/uL   Hemoglobin 12.5 (L) 13.0 - 17.0 g/dL   HCT 40.7 39.0 - 52.0 %    MCV 89.5 78.0 - 100.0 fL   MCH 27.5 26.0 - 34.0 pg   MCHC 30.7 30.0 - 36.0 g/dL   RDW 16.6 (H) 11.5 - 15.5 %   Platelets 134 (L) 150 - 400 K/uL   Neutrophils Relative % 81 %   Neutro Abs 4.1 1.7 - 7.7 K/uL   Lymphocytes Relative 15 %   Lymphs Abs 0.8 0.7 - 4.0 K/uL   Monocytes Relative 4 %   Monocytes Absolute 0.2 0.1 - 1.0 K/uL   Eosinophils Relative 0 %   Eosinophils Absolute 0.0 0.0 - 0.7 K/uL   Basophils Relative 0 %   Basophils Absolute 0.0 0.0 - 0.1 K/uL  Lipase, blood     Status: None   Collection Time: 11/04/15  2:17 AM  Result Value Ref Range   Lipase 26 11 - 51 U/L  Protime-INR     Status: Abnormal   Collection Time: 11/04/15  2:17 AM  Result Value Ref Range   Prothrombin Time 16.1 (H) 11.4 - 15.2 seconds   INR 1.28   Type and screen [facility]     Status: None   Collection Time: 11/04/15  2:17 AM  Result Value Ref Range   ABO/RH(D) A POS    Antibody Screen NEG    Sample Expiration 11/07/2015   I-Stat Chem 8, ED  (not at [facility], [facility])     Status: Abnormal   Collection Time: 11/04/15  2:27 AM  Result Value Ref Range   Sodium 141 135 - 145 mmol/L   Potassium 4.4 3.5 - 5.1 mmol/L   Chloride 99 (L) 101 - 111 mmol/L   BUN 16 6 - 20 mg/dL   Creatinine, Ser 0.50 (L) 0.61 - 1.24 mg/dL   Glucose, Bld 201 (H) 65 - 99 mg/dL   Calcium, Ion 1.16 1.13 - 1.30 mmol/L   TCO2 28 0 - 100 mmol/L   Hemoglobin 14.6 13.0 - 17.0 g/dL   HCT 43.0 39.0 - 52.0 %  I-Stat CG4 Lactic Acid, ED  (not at [facility])     Status: Abnormal   Collection Time: 11/04/15  2:28 AM  Result Value Ref Range   Lactic Acid, Venous 4.61 (HH) 0.5 - 1.9 mmol/L   Comment NOTIFIED PHYSICIAN   CBC     Status: Abnormal   Collection Time: 11/04/15  7:01 AM  Result Value Ref Range   WBC 6.0 4.0 - 10.5 K/uL   RBC 4.15 (L) 4.22 - 5.81 MIL/uL  Hemoglobin 11.9 (L) 13.0 - 17.0 g/dL   HCT 37.1 (L) 39.0 - 52.0 %   MCV 89.4 78.0 - 100.0 fL   MCH 28.7 26.0 - 34.0 pg   MCHC 32.1 30.0 - 36.0 g/dL   RDW 16.7 (H)  11.5 - 15.5 %   Platelets 143 (L) 150 - 400 K/uL  Basic metabolic panel     Status: Abnormal   Collection Time: 11/04/15  7:01 AM  Result Value Ref Range   Sodium 137 135 - 145 mmol/L   Potassium 4.9 3.5 - 5.1 mmol/L   Chloride 106 101 - 111 mmol/L   CO2 24 22 - 32 mmol/L   Glucose, Bld 247 (H) 65 - 99 mg/dL   BUN 19 6 - 20 mg/dL   Creatinine, Ser 0.58 (L) 0.61 - 1.24 mg/dL   Calcium 8.9 8.9 - 10.3 mg/dL   GFR calc non Af Amer >60 >60 mL/min   GFR calc Af Amer >60 >60 mL/min    Comment: (NOTE) The eGFR has been calculated using the CKD EPI equation. This calculation has not been validated in all clinical situations. eGFR's persistently <60 mL/min signify possible Chronic Kidney Disease.    Anion gap 7 5 - 15  Lactic acid, plasma     Status: Abnormal   Collection Time: 11/04/15  7:01 AM  Result Value Ref Range   Lactic Acid, Venous 4.1 (HH) 0.5 - 1.9 mmol/L    Comment: CRITICAL RESULT CALLED TO, READ BACK BY AND VERIFIED WITH: MCDANIEL,M AT 7:30AM ON 11/04/15 BY FESTERMAN,C   Glucose, capillary     Status: Abnormal   Collection Time: 11/04/15  7:24 AM  Result Value Ref Range   Glucose-Capillary 232 (H) 65 - 99 mg/dL   Comment 1 Notify RN    Comment 2 Document in Chart     Ct Abdomen Pelvis W Contrast  Result Date: 11/04/2015 CLINICAL DATA:  Lower abdominal crampy pain for 2 days, hematochezia at tonight. Poor historian. History of diabetes, hypertension. EXAM: CT ABDOMEN AND PELVIS WITH CONTRAST TECHNIQUE: Multidetector CT imaging of the abdomen and pelvis was performed using the standard protocol following bolus administration of intravenous contrast. CONTRAST:  161m ISOVUE-300 IOPAMIDOL (ISOVUE-300) INJECTION 61% COMPARISON:  None. FINDINGS: Altered anatomy as patient was scanned on LEFT side. LUNG BASES: Lingular and bibasilar atelectasis. Heart size is normal. No pericardial effusions. SOLID ORGANS: The liver, spleen, pancreas and adrenal glands are unremarkable. Dense  layering gallbladder sludge versus tiny stones, no CT findings of acute cholecystitis. GASTROINTESTINAL TRACT: Focally thickened, patulous duodenum with focal intramural gas and mild paraduodenal fat stranding. The stomach, large bowel are normal in course and caliber without inflammatory changes. Moderate descending and proximal sigmoid diverticulosis. Status post appendectomy. KIDNEYS/ URINARY TRACT: Kidneys are orthotopic, demonstrating symmetric enhancement. 3 mm RIGHT upper pole, 2 mm RIGHT upper pole, punctate RIGHT upper pole, 2 mm RIGHT interpolar and 5 mm RIGHT lower pole nephrolithiasis. 2 mm LEFT lower pole, punctate LEFT lower pole nephrolithiasis. No hydronephrosis or solid renal masses. The unopacified ureters are normal in course and caliber. Delayed imaging through the kidneys demonstrates symmetric prompt contrast excretion within the proximal urinary collecting system. Urinary bladder is well distended and unremarkable. PERITONEUM/RETROPERITONEUM: Aortoiliac vessels are normal in course and caliber, mild calcific atherosclerosis. No lymphadenopathy by CT size criteria. Mild prostatomegaly. RIGHT pelvic surgical clips. No intraperitoneal free fluid nor free air. SOFT TISSUE/OSSEOUS STRUCTURES: Non-suspicious. Small fat containing LEFT inguinal hernia. Ankylosis of the sacroiliac joints. Mild lumbar levoscoliosis.  Mild degenerative change of the thoracolumbar spine. IMPRESSION: Findings of acute duodenum ulcer, no definite perforation and no abscess. Recommend endoscopy. Colonic diverticulosis.  Cholelithiasis. Nonobstructing bilateral nephrolithiasis. These results will be called to the ordering clinician or representative by the Radiologist Assistant, and communication documented in the PACS or zVision Dashboard. Electronically Signed   By: Elon Alas M.D.   On: 11/04/2015 05:35    Review of Systems  Constitutional: Positive for fever, malaise/fatigue and weight loss. Negative for  chills.   Blood pressure 109/61, pulse (!) 122, temperature 98.6 F (37 C), temperature source Oral, resp. rate (!) 23, height '5\' 4"'  (1.626 m), weight 241 lb 2.9 oz (109.4 kg), SpO2 95 %. Physical Exam Alert and oriented. Skin warm and dry. Oral mucosa is moist.   . Sclera anicteric, conjunctivae is pink. Thyroid not enlarged. No cervical lymphadenopathy. Lungs clear. Heart regular rate and rhythm.  Abdomen is soft. Bowel sounds are positive. No hepatomegaly. No abdominal masses felt. Slight tenderness epigastric and lower abdomen.   No edema to lower extremities.  .  Assessment/Plan: Possible UGI bleed. CT revealed acte duodenal ulcer. HR up 120s Will discuss with Dr. Laural Golden.   SETZER,TERRI W 11/04/2015, 7:45 AM    GI attending note: Patient interviewed and examined. Abdominopelvic CT reviewed with Lavonia Dana. Patient presents with acute onset of barium-like/epigastric pain and rectal bleeding in the form of methadone colored blood. Patient takes 1 Aleve tablet every morning. No history of peptic ulcer disease. Abdominal exam reveals normal bowel sounds and mild hypogastric tenderness. CT reveals abnormality involving junction of second and third part of the duodenum with intramural air and slight expansion of duodenal diameter distally. Suspect duodenal diverticulitis. Peptic ulcer disease less likely in this area. He could've bled from this area or there could be colonic source of bleeding. Hemoglobin has dropped from 12.5 on admission to 11.7(was 14.6 in between).   Recommendations: Agree with IV antibiotics. Agree with Dr. Luan Pulling plan to give him a unit of blood since he cell tachycardic. EGD within the next 24-48 hours pending on clinical course.

## 2015-11-04 NOTE — H&P (Addendum)
History and Physical    Dwayne Davies ZOX:[id number] DOB: 1955-08-18 DOA: 11/04/2015  PCP: Dwayne Maudlin, MD  Patient coming from: rectal bleeding, lower abdominal pain  Chief Complaint:  Rectal bleeding  HPI: Dwayne Davies is a 59 y.o. male comes in with acute onset of maroonish rectal bleeding that occurred tonight.  Pt reports having 2 days of lower abdominal crampy pain.  He is very poor historian.  Denies any fevers.  No n/v.  No epigastric pain.  Pt has h/o ulcers in the past but no h/o GI bleed.  Pt on arrival tachycardic, with normal bp and lactic acid level over 4.  Given 2 liters of ivf.  Ct scan is pending.  Referred for admission for GIB likely diverticular.   Review of Systems: As per HPI otherwise 10 point review of systems negative.   Past Medical History:  Diagnosis Date  . Anxiety   . Chest pain   . Chronic back pain   . Chronic pain in left shoulder   . COPD (chronic obstructive pulmonary disease) ([facility])   . Diabetes mellitus   . Elevated lipids   . GERD (gastroesophageal reflux disease)   . Gout   . Gout   . Hypertension   . Normal cardiac stress test 2000, 2005  . Shortness of breath     Past Surgical History:  Procedure Laterality Date  . CARDIAC CATHETERIZATION     x2 (last 2001), both normal coronary arteries  . CATARACT EXTRACTION W/PHACO  04/08/2012   Procedure: CATARACT EXTRACTION PHACO AND INTRAOCULAR LENS PLACEMENT (IOC);  Surgeon: Susa Simmonds, MD;  Location: [facility];  Service: Ophthalmology;  Laterality: Right;  CDE:40.75     reports that he has never smoked. He has never used smokeless tobacco. He reports that he does not drink alcohol or use drugs.  No Known Allergies  Family History  Problem Relation Age of Onset  . CAD Father   . Coronary artery disease Neg Hx     Prior to Admission medications   Medication Sig Start Date End Date Taking? Authorizing Provider  albuterol (PROVENTIL HFA;VENTOLIN HFA) 108 (90 BASE) MCG/ACT  inhaler Inhale 2 puffs into the lungs every 6 (six) hours as needed. Shortness of Breath    Historical Provider, MD  Blood Glucose Monitoring Suppl (ACCU-CHEK AVIVA) device Use as instructed 03/10/15   Roma Kayser, MD  canagliflozin [facility]) 100 MG TABS tablet Take 3 tablets (300 mg total) by mouth daily before breakfast. 06/14/15   Roma Kayser, MD  colchicine 0.6 MG tablet Take 0.6 mg by mouth daily.    Historical Provider, MD  furosemide (LASIX) 40 MG tablet Take 40 mg by mouth.    Historical Provider, MD  glucose blood (ACCU-CHEK AVIVA) test strip Use to test glucose 4 times a day. 03/10/15   Roma Kayser, MD  lisinopril-hydrochlorothiazide (PRINZIDE,ZESTORETIC) 10-12.5 MG tablet TAKE ONE TABLET BY MOUTH ONCE DAILY. 08/05/15   Roma Kayser, MD  meclizine (ANTIVERT) 25 MG tablet Take 25 mg by mouth 3 (three) times daily as needed for dizziness.    Historical Provider, MD  metFORMIN (GLUCOPHAGE) 1000 MG tablet Take 1 tablet (1,000 mg total) by mouth 2 (two) times daily with a meal. 06/14/15   Roma Kayser, MD  omeprazole (PRILOSEC) 20 MG capsule Take 20 mg by mouth daily.      Historical Provider, MD  oxyCODONE-acetaminophen (PERCOCET/ROXICET) 5-325 MG per tablet 1 or 2 tabs PO q6h prn pain  09/07/13   Samuel Jester, DO  tamsulosin (FLOMAX) 0.4 MG CAPS capsule Take 0.4 mg by mouth.    Historical Provider, MD    Physical Exam: Vitals:   11/04/15 0330 11/04/15 0345 11/04/15 0400 11/04/15 0428  BP: 132/84  (!) 144/103   Pulse:      Resp: 25 (!) 28    Temp:    97.4 F (36.3 C)  TempSrc:    Rectal  SpO2:      Weight:      Height:          Constitutional: NAD, calm, comfortable Vitals:   11/04/15 0330 11/04/15 0345 11/04/15 0400 11/04/15 0428  BP: 132/84  (!) 144/103   Pulse:      Resp: 25 (!) 28    Temp:    97.4 F (36.3 C)  TempSrc:    Rectal  SpO2:      Weight:      Height:       Eyes: PERRL, lids and conjunctivae normal ENMT: Mucous  membranes are moist. Posterior pharynx clear of any exudate or lesions.Normal dentition.  Neck: normal, supple, no masses, no thyromegaly Respiratory: clear to auscultation bilaterally, no wheezing, no crackles. Normal respiratory effort. No accessory muscle use.  Cardiovascular: Regular rate and rhythm, no murmurs / rubs / gallops. No extremity edema. 2+ pedal pulses. No carotid bruits.  Abdomen: no tenderness, no masses palpated. No hepatosplenomegaly. Bowel sounds positive.  Musculoskeletal: no clubbing / cyanosis. No joint deformity upper and lower extremities. Good ROM, no contractures. Normal muscle tone.  Skin: no rashes, lesions, ulcers. No induration Neurologic: CN 2-12 grossly intact. Sensation intact, DTR normal. Strength 5/5 in all 4.  Psychiatric: Normal judgment and insight. Alert and oriented x 3. Normal mood.    Labs on Admission: I have personally reviewed following labs and imaging studies  CBC:  Recent Labs Lab 11/04/15 0217 11/04/15 0227  WBC 5.1  --   NEUTROABS 4.1  --   HGB 12.5* 14.6  HCT 40.7 43.0  MCV 89.5  --   PLT 134*  --    Basic Metabolic Panel:  Recent Labs Lab 11/04/15 0217 11/04/15 0227  NA 138 141  K 4.4 4.4  CL 102 99*  CO2 29  --   GLUCOSE 217* 201*  BUN 17 16  CREATININE 0.54* 0.50*  CALCIUM 9.0  --    GFR: Estimated Creatinine Clearance: 115.2 mL/min (by C-G formula based on SCr of 0.8 mg/dL). Liver Function Tests:  Recent Labs Lab 11/04/15 0217  AST 34  ALT 33  ALKPHOS 54  BILITOT 1.0  PROT 7.5  ALBUMIN 3.5    Recent Labs Lab 11/04/15 0217  LIPASE 26   No results for input(s): AMMONIA in the last 168 hours. Coagulation Profile:  Recent Labs Lab 11/04/15 0217  INR 1.28   Radiological Exams on Admission: No results found.  Ct scan pending   Assessment/Plan 60 yo male with lower abdominal pain and rectal bleeding with lactic acidosis  Principal Problem:   Rectal bleeding- this sounds more concerning for  ischemic bowel vs underlying infectious cause.  Not classic for diverticular bleed esp with his abdominal cramps/pain.  Will cover with iv flagyl and cipro.  hgb is over 4.  Given 2 liters of ivf in the ED, will obtain lactic acid level and cbc now.  Admit to stepdown.  Ct of his abdomen and pelvis is pending.  abd exam is benign.   Active Problems:   Type II  diabetes mellitus, uncontrolled (HCC)- place on ssi, npo   Essential hypertension, benign- noted   Morbid obesity due to excess calories ([facility]CC)- noted   Lactic acidosis- as above, accessing for ischemic vs infectious cause of his rectal bleeding     DVT prophylaxis: scd only Code Status:  Full code  pcp is dr Flonnie Overman MD [facility]  If 7PM-7AM, please contact night-coverage [email or web address] Password [email or web address]  11/04/2015, 4:48 AM    Ct scan shows acute duodenal ulcer without perforation.  GI consult put in.  Continue on protonix drip.

## 2015-11-04 NOTE — ED Notes (Signed)
Pt to Xray.

## 2015-11-04 NOTE — ED Notes (Signed)
Pt's O2 sats on room air=87%. Placed pt on O2 @ 2L [place]. Sats up to 96%.

## 2015-11-04 NOTE — ED Triage Notes (Signed)
Bright red rectal bleeding into the commode tonight.  Pt reports pain to lower abd 10/10, nausea

## 2015-11-05 ENCOUNTER — Encounter (HOSPITAL_COMMUNITY)
Admission: EM | Disposition: A | Discharge status: Home / Self Care | Payer: Self-pay | Source: Home / Self Care | Attending: Pulmonary Disease

## 2015-11-05 ENCOUNTER — Encounter (HOSPITAL_COMMUNITY): Payer: Self-pay | Admitting: *Deleted

## 2015-11-05 ENCOUNTER — Inpatient Hospital Stay (HOSPITAL_COMMUNITY): Payer: Medicare Other | Admitting: Anesthesiology

## 2015-11-05 DIAGNOSIS — K3189 Other diseases of stomach and duodenum: Secondary | ICD-10-CM

## 2015-11-05 DIAGNOSIS — K315 Obstruction of duodenum: Secondary | ICD-10-CM

## 2015-11-05 DIAGNOSIS — S31801A Laceration without foreign body of unspecified buttock, initial encounter: Secondary | ICD-10-CM | POA: Diagnosis present

## 2015-11-05 DIAGNOSIS — K922 Gastrointestinal hemorrhage, unspecified: Secondary | ICD-10-CM

## 2015-11-05 DIAGNOSIS — K264 Chronic or unspecified duodenal ulcer with hemorrhage: Secondary | ICD-10-CM

## 2015-11-05 DIAGNOSIS — R933 Abnormal findings on diagnostic imaging of other parts of digestive tract: Secondary | ICD-10-CM

## 2015-11-05 DIAGNOSIS — K228 Other specified diseases of esophagus: Secondary | ICD-10-CM

## 2015-11-05 DIAGNOSIS — R571 Hypovolemic shock: Secondary | ICD-10-CM | POA: Diagnosis present

## 2015-11-05 DIAGNOSIS — D62 Acute posthemorrhagic anemia: Secondary | ICD-10-CM | POA: Diagnosis present

## 2015-11-05 DIAGNOSIS — K2980 Duodenitis without bleeding: Secondary | ICD-10-CM

## 2015-11-05 HISTORY — PX: ESOPHAGOGASTRODUODENOSCOPY (EGD) WITH PROPOFOL: SHX5813

## 2015-11-05 LAB — HEMOGLOBIN AND HEMATOCRIT, BLOOD
HCT: 34.1 % — ABNORMAL LOW (ref 39.0–52.0)
Hemoglobin: 10.8 g/dL — ABNORMAL LOW (ref 13.0–17.0)

## 2015-11-05 LAB — GLUCOSE, CAPILLARY
GLUCOSE-CAPILLARY: 142 mg/dL — AB (ref 65–99)
GLUCOSE-CAPILLARY: 180 mg/dL — AB (ref 65–99)
GLUCOSE-CAPILLARY: 185 mg/dL — AB (ref 65–99)
GLUCOSE-CAPILLARY: 215 mg/dL — AB (ref 65–99)
Glucose-Capillary: 118 mg/dL — ABNORMAL HIGH (ref 65–99)
Glucose-Capillary: 143 mg/dL — ABNORMAL HIGH (ref 65–99)
Glucose-Capillary: 146 mg/dL — ABNORMAL HIGH (ref 65–99)
Glucose-Capillary: 180 mg/dL — ABNORMAL HIGH (ref 65–99)

## 2015-11-05 LAB — BASIC METABOLIC PANEL
Anion gap: 3 — ABNORMAL LOW (ref 5–15)
BUN: 15 mg/dL (ref 6–20)
CALCIUM: 7.5 mg/dL — AB (ref 8.9–10.3)
CO2: 31 mmol/L (ref 22–32)
Chloride: 103 mmol/L (ref 101–111)
Creatinine, Ser: 0.48 mg/dL — ABNORMAL LOW (ref 0.61–1.24)
GFR calc Af Amer: >60 mL/min (ref >60)
GLUCOSE: 160 mg/dL — AB (ref 65–99)
Potassium: 3.7 mmol/L (ref 3.5–5.1)
SODIUM: 137 mmol/L (ref 135–145)

## 2015-11-05 LAB — CBC
HCT: 34.5 % — ABNORMAL LOW (ref 39.0–52.0)
Hemoglobin: 10.8 g/dL — ABNORMAL LOW (ref 13.0–17.0)
MCH: 28.5 pg (ref 26.0–34.0)
MCHC: 31.3 g/dL (ref 30.0–36.0)
MCV: 91 fL (ref 78.0–100.0)
Platelets: 134 10*3/uL — ABNORMAL LOW (ref 150–400)
RBC: 3.79 MIL/uL — ABNORMAL LOW (ref 4.22–5.81)
RDW: 17 % — AB (ref 11.5–15.5)
WBC: 5 10*3/uL (ref 4.0–10.5)

## 2015-11-05 SURGERY — ESOPHAGOGASTRODUODENOSCOPY (EGD) WITH PROPOFOL
Anesthesia: Monitor Anesthesia Care

## 2015-11-05 MED ORDER — MIDAZOLAM HCL 2 MG/2ML IJ SOLN
INTRAMUSCULAR | Status: AC
  Filled 2015-11-05: qty 2

## 2015-11-05 MED ORDER — SUCRALFATE 1 GM/10ML PO SUSP
1.0000 g | Freq: Three times a day (TID) | ORAL | Status: DC
  Administered 2015-11-05 – 2015-11-09 (×15): 1 g via ORAL
  Filled 2015-11-05 (×16): qty 10

## 2015-11-05 MED ORDER — SODIUM CHLORIDE 0.9 % IV SOLN
INTRAVENOUS | Status: DC
  Administered 2015-11-05 – 2015-11-07 (×3): via INTRAVENOUS

## 2015-11-05 MED ORDER — BUTAMBEN-TETRACAINE-BENZOCAINE 2-2-14 % EX AERO
1.0000 | INHALATION_SPRAY | Freq: Once | CUTANEOUS | Status: AC
  Administered 2015-11-05: 1 via TOPICAL

## 2015-11-05 MED ORDER — IPRATROPIUM-ALBUTEROL 0.5-2.5 (3) MG/3ML IN SOLN
3.0000 mL | Freq: Once | RESPIRATORY_TRACT | Status: AC
  Administered 2015-11-05: 3 mL via RESPIRATORY_TRACT

## 2015-11-05 MED ORDER — EPINEPHRINE HCL 0.1 MG/ML IJ SOSY
PREFILLED_SYRINGE | INTRAMUSCULAR | Status: AC
  Filled 2015-11-05: qty 10

## 2015-11-05 MED ORDER — FUROSEMIDE 10 MG/ML IJ SOLN
20.0000 mg | Freq: Once | INTRAMUSCULAR | Status: AC
  Administered 2015-11-05: 20 mg via INTRAVENOUS

## 2015-11-05 MED ORDER — PROPOFOL 10 MG/ML IV BOLUS
INTRAVENOUS | Status: DC | PRN
  Administered 2015-11-05: 20 mg via INTRAVENOUS

## 2015-11-05 MED ORDER — PROPOFOL 10 MG/ML IV BOLUS
INTRAVENOUS | Status: AC
  Filled 2015-11-05: qty 40

## 2015-11-05 MED ORDER — PROPOFOL 500 MG/50ML IV EMUL
INTRAVENOUS | Status: DC | PRN
  Administered 2015-11-05: 75 ug/kg/min via INTRAVENOUS

## 2015-11-05 MED ORDER — ALBUTEROL SULFATE (2.5 MG/3ML) 0.083% IN NEBU
INHALATION_SOLUTION | RESPIRATORY_TRACT | Status: AC
Start: 2015-11-05 — End: 2015-11-05
  Filled 2015-11-05: qty 3

## 2015-11-05 MED ORDER — EPINEPHRINE HCL 0.1 MG/ML IJ SOSY
PREFILLED_SYRINGE | INTRAMUSCULAR | Status: DC | PRN
  Administered 2015-11-05: 2 mL via INTRAMUSCULAR

## 2015-11-05 MED ORDER — MIDAZOLAM HCL 2 MG/2ML IJ SOLN
1.0000 mg | INTRAMUSCULAR | Status: DC | PRN
  Administered 2015-11-05: 2 mg via INTRAVENOUS

## 2015-11-05 MED ORDER — SODIUM CHLORIDE 0.9 % IV SOLN: INTRAVENOUS | Status: DC

## 2015-11-05 MED ORDER — FENTANYL CITRATE (PF) 100 MCG/2ML IJ SOLN: 25.0000 ug | INTRAMUSCULAR | Status: DC | PRN

## 2015-11-05 MED ORDER — IPRATROPIUM-ALBUTEROL 0.5-2.5 (3) MG/3ML IN SOLN
RESPIRATORY_TRACT | Status: AC
  Filled 2015-11-05: qty 3

## 2015-11-05 MED ORDER — FENTANYL CITRATE (PF) 100 MCG/2ML IJ SOLN
INTRAMUSCULAR | Status: AC
  Filled 2015-11-05: qty 2

## 2015-11-05 MED ORDER — SODIUM CHLORIDE 0.9 % IN NEBU
INHALATION_SOLUTION | RESPIRATORY_TRACT | Status: AC
  Filled 2015-11-05: qty 3

## 2015-11-05 MED ORDER — FENTANYL CITRATE (PF) 100 MCG/2ML IJ SOLN
25.0000 ug | INTRAMUSCULAR | Status: AC
  Administered 2015-11-05 (×2): 25 ug via INTRAVENOUS

## 2015-11-05 MED ORDER — ONDANSETRON HCL 4 MG/2ML IJ SOLN: 4.0000 mg | Freq: Once | INTRAMUSCULAR | Status: DC | PRN

## 2015-11-05 MED ORDER — LACTATED RINGERS IV SOLN
INTRAVENOUS | Status: DC
  Administered 2015-11-05: 10:00:00 via INTRAVENOUS

## 2015-11-05 MED ORDER — FUROSEMIDE 10 MG/ML IJ SOLN
INTRAMUSCULAR | Status: AC
  Filled 2015-11-05: qty 4

## 2015-11-05 NOTE — Transfer of Care (Signed)
Immediate Anesthesia Transfer of Care Note  Patient: Dwayne Davies  Procedure(s) Performed: Procedure(s): ESOPHAGOGASTRODUODENOSCOPY (EGD) WITH PROPOFOL (N/A)  Patient Location: PACU  Anesthesia Type:MAC  Level of Consciousness: awake and patient cooperative  Airway & Oxygen Therapy: Patient Spontanous Breathing and non-rebreather face mask  Post-op Assessment: Report given to RN, Post -op Vital signs reviewed and stable and Patient moving all extremities  Post vital signs: Reviewed and stable    Last Pain:  Vitals:   11/05/15 1005  TempSrc:   PainSc: 0-No pain      Patients Stated Pain Goal: 6 (11/05/15 1005)  Complications: No apparent anesthesia complications

## 2015-11-05 NOTE — Anesthesia Procedure Notes (Signed)
Procedure Name: MAC Date/Time: 11/05/2015 9:59 AM Performed by: Franco Nones Pre-anesthesia Checklist: Patient identified, Emergency Drugs available, Suction available, Timeout performed and Patient being monitored Patient Re-evaluated:Patient Re-evaluated prior to inductionOxygen Delivery Method: Non-rebreather mask

## 2015-11-05 NOTE — OR Nursing (Signed)
Patient arrived to pre-op lying on left side. Patient stated he could breathe easier when on his left side. Dr. Jayme Cloud in and ordered Duoneb and Lasix 20mg  IV. Orders carried out. Patient states, "I feel much better."

## 2015-11-05 NOTE — Op Note (Signed)
[facility] Patient Name: Dwayne Davies Procedure Date: 11/05/2015 10:03 AM MRN: [id number] Date of Birth: 1955/12/02 Attending MD: Lionel December , MD CSN: [id number] Age: 60 Admit Type: Inpatient Procedure:                Upper GI endoscopy Indications:              Suspected upper gastrointestinal bleeding, Abnormal                            CT of the GI tract Providers:                Lionel December, MD, Nena Polio, RN, Birder Robson,                            Technician Referring MD:             [name], MD Medicines:                Propofol per Anesthesia Complications:            No immediate complications. Estimated Blood Loss:     Estimated blood loss: none. Estimated blood loss                            was minimal. Procedure:                Pre-Anesthesia Assessment:                           - Prior to the procedure, a History and Physical                            was performed, and patient medications and                            allergies were reviewed. The patient's tolerance of                            previous anesthesia was also reviewed. The risks                            and benefits of the procedure and the sedation                            options and risks were discussed with the patient.                            All questions were answered, and informed consent                            was obtained. Prior Anticoagulants: The patient                            last took naproxen 2 days prior to the procedure.  ASA Grade Assessment: III - A patient with severe                            systemic disease. After reviewing the risks and                            benefits, the patient was deemed in satisfactory                            condition to undergo the procedure.                           After obtaining informed consent, the endoscope was                            passed under direct vision.  Throughout the                            procedure, the patient's blood pressure, pulse, and                            oxygen saturations were monitored continuously. The                            EG-[id number] ([id number]) scope was introduced through the                            mouth, and advanced to the third part of duodenum.                            The upper GI endoscopy was technically difficult                            and complex due to abnormal anatomy and the                            patient's respiratory instability. The patient                            tolerated the procedure well. Scope In: 10:14:11 AM Scope Out: 10:33:42 AM Total Procedure Duration: 0 hours 19 minutes 31 seconds  Findings:      The upper third of the esophagus and middle third of the esophagus were       normal.      There were esophageal mucosal changes consistent with short-segment       Barrett's esophagus present in the lower third of the esophagus. The       maximum longitudinal extent of these mucosal changes was 2 cm in length.      The Z-line was irregular and was found 38 cm from the incisors.      Multiple dispersed, small non-bleeding erosions were found in the       gastric antrum and in the prepyloric region of the stomach. There were       no stigmata of recent bleeding.  The exam of the stomach was otherwise normal.      Diffuse mild inflammation characterized by congestion (edema), erythema       and granularity was found in the duodenal bulb.      An acquired benign-appearing, intrinsic mild stenosis was found in the       second portion of the duodenum.      One oozing cratered duodenal ulcer with a visible vessel was found in       the second portion of the duodenum. The lesion was ten mm by twenty mm       in largest dimension. Coagulation for bleeding prevention using heater       probe was unsuccessful. Area was successfully injected with 2 mL of a       1:10,000 solution  of epinephrine for hemostasis. To stop active       bleeding, two hemostatic clips were successfully placed (MR       conditional). There was no bleeding at the end of the procedure. Impression:               - Normal upper third of esophagus and middle third                            of esophagus.                           - Esophageal mucosal changes consistent with                            short-segment Barrett's esophagus.                           - Z-line irregular, 38 cm from the incisors.                           - Non-bleeding erosive gastropathy.                           - Duodenitis.                           - Acquired duodenal stenosis.                           - One oozing duodenal ulcer with a visible vessel.                            ulcer located distal to stricture. Treated with a                            heater probe. thermotherapy with gold probe not                            successful as patient constantly moving.                            Injectedwith 2 mL of 1 in 10,000 epinephrine  followed by application of rwo 360 clips.Clips (MR                            conditional).                           - No specimens collected. Moderate Sedation:      Per Anesthesia Care      Per Anesthesia Care Recommendation:           - Return patient to ICU for ongoing care.                           - Clear liquid diet today.                           - Continue present medications.                           - Use sucralfate suspension 1 gram PO QID.                           - Perform an H. pylori serology.                           - Repeat upper endoscopy in 2 months to check                            healing and for follow-up of Barrett's ablation. Procedure Code(s):        --- Professional ---                           [phone], Esophagogastroduodenoscopy, flexible,                            transoral; with control of bleeding, any  method Diagnosis Code(s):        --- Professional ---                           K22.8, Other specified diseases of esophagus                           K31.89, Other diseases of stomach and duodenum                           K29.80, Duodenitis without bleeding                           K31.5, Obstruction of duodenum                           K26.4, Chronic or unspecified duodenal ulcer with                            hemorrhage                           R93.3,  Abnormal findings on diagnostic imaging of                            other parts of digestive tract CPT copyright 2016 [employer]. All rights reserved. The codes documented in this report are preliminary and upon coder review may  be revised to meet current compliance requirements. Lionel December, MD Lionel December, MD 11/05/2015 10:52:12 AM This report has been signed electronically. Number of Addenda: 0

## 2015-11-05 NOTE — Progress Notes (Signed)
Was told in AM shift report that patient had a pressure ulcer on his sacrum covered by a sacral foam dressing. MD and RN assessed sacrum and found a small crack in between his buttocks. Both agreed that this was not a pressure ulcer and may have occurred from shearing while being repositioned in the bed. Sacral foam replaced.

## 2015-11-05 NOTE — Progress Notes (Signed)
Patient has no complaints. He denies nausea vomiting abdominal pain. He has not had melena or bowel movement today. He is hungry. He is sitting up in recliner and appears to be comfortable. Abdomen is soft and nontender. H&H 10.8 and 34.1.  Patient appears to be doing well following therapeutic EGD. No evidence of recurrent GI bleed. Diet advanced to full liquids. Will continue pantoprazole infusion for another 48 hours. Patient informed that he must not take NSAIDs when he leaves the hospital.

## 2015-11-05 NOTE — Anesthesia Postprocedure Evaluation (Signed)
Anesthesia Post Note  Patient: Dwayne Davies  Procedure(s) Performed: Procedure(s) (LRB): ESOPHAGOGASTRODUODENOSCOPY (EGD) WITH PROPOFOL (N/A)  Patient location during evaluation: PACU Anesthesia Type: MAC Level of consciousness: awake Pain management: pain level controlled Vital Signs Assessment: post-procedure vital signs reviewed and stable Respiratory status: spontaneous breathing and non-rebreather facemask Cardiovascular status: stable Anesthetic complications: no    Last Vitals:  Vitals:   11/05/15 0945 11/05/15 1050  BP: (!) 141/78 (!) 136/94  Pulse:  (!) 115  Resp: (!) 23 (!) 31  Temp:  (P) 37.1 C    Last Pain:  Vitals:   11/05/15 1005  TempSrc:   PainSc: 0-No pain                 Curlee Bogan

## 2015-11-05 NOTE — Care Management Important Message (Signed)
Important Message  Patient Details  Name: Dwayne Davies MRN: [id number] Date of Birth: 1956/01/16   Medicare Important Message Given:  Yes    [name], RN 11/05/2015, 8:37 AM

## 2015-11-05 NOTE — Care Management Note (Signed)
Case Management Note  Patient Details  Name: Dwayne Davies MRN: [id number] Date of Birth: 1955/08/20  Subjective/Objective:                  Pt admitted with GIB. Pt is from home, lives with his brother and son. Pt is ind with ADL's, he has PCP, drives himself to appointments and has no difficulty affording medications. Pt has no HH services or DME needs PTA. Plan to return home with self care at DC.   Action/Plan: No CM needs anticipated.   Expected Discharge Date:  11/09/15               Expected Discharge Plan:  Home/Self Care  In-House Referral:  NA  Discharge planning Services  CM Consult  Post Acute Care Choice:  NA Choice offered to:  NA  DME Arranged:    DME Agency:     HH Arranged:    HH Agency:     Status of Service:  Completed, signed off  If discussed at [employer] of Stay Meetings, dates discussed:    Additional Comments:  [name], RN 11/05/2015, 8:37 AM

## 2015-11-05 NOTE — Progress Notes (Signed)
Subjective: He says he feels better. He's not had any obvious bleeding in the last 6 or 8 hours. His hemoglobin level has stabilized and he has not required a blood transfusion. He has no other new complaints.  Objective: Vital signs in last 24 hours: Temp:  [96.9 F (36.1 C)-99.1 F (37.3 C)] 96.9 F (36.1 C) (08/04 0400) Pulse Rate:  [82-127] 82 (08/04 0600) Resp:  [15-28] 19 (08/04 0600) BP: (90-130)/(56-83) 116/68 (08/04 0600) SpO2:  [94 %-100 %] 100 % (08/04 0600) FiO2 (%):  [32 %] 32 % (08/03 1800) Weight:  [109.2 kg (240 lb 11.9 oz)] 109.2 kg (240 lb 11.9 oz) (08/04 0400) Weight change: -4.199 kg (-9 lb 4.1 oz) Last BM Date: 11/04/15  Intake/Output from previous day: 08/03 0701 - 08/04 0700 In: 230 [I.V.:130; IV Piggyback:100] Out: 2426 [Urine:2425; Stool:1]  PHYSICAL EXAM General appearance: alert, cooperative and mild distress Resp: clear to auscultation bilaterally Cardio: regular rate and rhythm, S1, S2 normal, no murmur, click, rub or gallop GI: Minimal tenderness in the epigastric region 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  Lab Results:  Results for orders placed or performed during the hospital encounter of 11/04/15 (from the past 48 hour(s))  POC occult blood, ED     Status: Abnormal   Collection Time: 11/04/15  1:52 AM  Result Value Ref Range   Fecal Occult Bld POSITIVE (A) NEGATIVE  Comprehensive metabolic panel     Status: Abnormal   Collection Time: 11/04/15  2:17 AM  Result Value Ref Range   Sodium 138 135 - 145 mmol/L   Potassium 4.4 3.5 - 5.1 mmol/L   Chloride 102 101 - 111 mmol/L   CO2 29 22 - 32 mmol/L   Glucose, Bld 217 (H) 65 - 99 mg/dL   BUN 17 6 - 20 mg/dL   Creatinine, Ser 0.54 (L) 0.61 - 1.24 mg/dL   Calcium 9.0 8.9 - 10.3 mg/dL   Total Protein 7.5 6.5 - 8.1 g/dL   Albumin 3.5 3.5 - 5.0 g/dL   AST 34 15 - 41 U/L   ALT 33 17 - 63 U/L   Alkaline Phosphatase 54 38 - 126 U/L   Total Bilirubin 1.0 0.3 - 1.2 mg/dL    GFR calc non Af Amer >60 >60 mL/min   GFR calc Af Amer >60 >60 mL/min    Comment: (NOTE) The eGFR has been calculated using the CKD EPI equation. This calculation has not been validated in all clinical situations. eGFR's persistently <60 mL/min signify possible Chronic Kidney Disease.    Anion gap 7 5 - 15  CBC WITH DIFFERENTIAL     Status: Abnormal   Collection Time: 11/04/15  2:17 AM  Result Value Ref Range   WBC 5.1 4.0 - 10.5 K/uL   RBC 4.55 4.22 - 5.81 MIL/uL   Hemoglobin 12.5 (L) 13.0 - 17.0 g/dL   HCT 40.7 39.0 - 52.0 %   MCV 89.5 78.0 - 100.0 fL   MCH 27.5 26.0 - 34.0 pg   MCHC 30.7 30.0 - 36.0 g/dL   RDW 16.6 (H) 11.5 - 15.5 %   Platelets 134 (L) 150 - 400 K/uL   Neutrophils Relative % 81 %   Neutro Abs 4.1 1.7 - 7.7 K/uL   Lymphocytes Relative 15 %   Lymphs Abs 0.8 0.7 - 4.0 K/uL   Monocytes Relative 4 %   Monocytes Absolute 0.2 0.1 - 1.0 K/uL   Eosinophils Relative 0 %   Eosinophils Absolute  0.0 0.0 - 0.7 K/uL   Basophils Relative 0 %   Basophils Absolute 0.0 0.0 - 0.1 K/uL  Lipase, blood     Status: None   Collection Time: 11/04/15  2:17 AM  Result Value Ref Range   Lipase 26 11 - 51 U/L  Protime-INR     Status: Abnormal   Collection Time: 11/04/15  2:17 AM  Result Value Ref Range   Prothrombin Time 16.1 (H) 11.4 - 15.2 seconds   INR 1.28   Type and screen [facility]     Status: None   Collection Time: 11/04/15  2:17 AM  Result Value Ref Range   ABO/RH(D) A POS    Antibody Screen NEG    Sample Expiration 11/07/2015   I-Stat Chem 8, ED  (not at [facility], [facility])     Status: Abnormal   Collection Time: 11/04/15  2:27 AM  Result Value Ref Range   Sodium 141 135 - 145 mmol/L   Potassium 4.4 3.5 - 5.1 mmol/L   Chloride 99 (L) 101 - 111 mmol/L   BUN 16 6 - 20 mg/dL   Creatinine, Ser 0.50 (L) 0.61 - 1.24 mg/dL   Glucose, Bld 201 (H) 65 - 99 mg/dL   Calcium, Ion 1.16 1.13 - 1.30 mmol/L   TCO2 28 0 - 100 mmol/L   Hemoglobin 14.6 13.0 - 17.0 g/dL   HCT  43.0 39.0 - 52.0 %  I-Stat CG4 Lactic Acid, ED  (not at [facility])     Status: Abnormal   Collection Time: 11/04/15  2:28 AM  Result Value Ref Range   Lactic Acid, Venous 4.61 (HH) 0.5 - 1.9 mmol/L   Comment NOTIFIED PHYSICIAN   CBC     Status: Abnormal   Collection Time: 11/04/15  7:01 AM  Result Value Ref Range   WBC 6.0 4.0 - 10.5 K/uL   RBC 4.15 (L) 4.22 - 5.81 MIL/uL   Hemoglobin 11.9 (L) 13.0 - 17.0 g/dL   HCT 37.1 (L) 39.0 - 52.0 %   MCV 89.4 78.0 - 100.0 fL   MCH 28.7 26.0 - 34.0 pg   MCHC 32.1 30.0 - 36.0 g/dL   RDW 16.7 (H) 11.5 - 15.5 %   Platelets 143 (L) 150 - 400 K/uL  Basic metabolic panel     Status: Abnormal   Collection Time: 11/04/15  7:01 AM  Result Value Ref Range   Sodium 137 135 - 145 mmol/L   Potassium 4.9 3.5 - 5.1 mmol/L   Chloride 106 101 - 111 mmol/L   CO2 24 22 - 32 mmol/L   Glucose, Bld 247 (H) 65 - 99 mg/dL   BUN 19 6 - 20 mg/dL   Creatinine, Ser 0.58 (L) 0.61 - 1.24 mg/dL   Calcium 8.9 8.9 - 10.3 mg/dL   GFR calc non Af Amer >60 >60 mL/min   GFR calc Af Amer >60 >60 mL/min    Comment: (NOTE) The eGFR has been calculated using the CKD EPI equation. This calculation has not been validated in all clinical situations. eGFR's persistently <60 mL/min signify possible Chronic Kidney Disease.    Anion gap 7 5 - 15  Lactic acid, plasma     Status: Abnormal   Collection Time: 11/04/15  7:01 AM  Result Value Ref Range   Lactic Acid, Venous 4.1 (HH) 0.5 - 1.9 mmol/L    Comment: CRITICAL RESULT CALLED TO, READ BACK BY AND VERIFIED WITH: MCDANIEL,M AT 7:30AM ON 11/04/15 BY Derrill Memo  Glucose, capillary     Status: Abnormal   Collection Time: 11/04/15  7:24 AM  Result Value Ref Range   Glucose-Capillary 232 (H) 65 - 99 mg/dL   Comment 1 Notify RN    Comment 2 Document in Chart   Lactic acid, plasma     Status: Abnormal   Collection Time: 11/04/15  9:01 AM  Result Value Ref Range   Lactic Acid, Venous 3.0 (HH) 0.5 - 1.9 mmol/L    Comment: CRITICAL  RESULT CALLED TO, READ BACK BY AND VERIFIED WITH: HILTON,L AT 9:35AM ON 11/04/15 BY FESTERMAN,C   Hemoglobin and hematocrit, blood     Status: Abnormal   Collection Time: 11/04/15  9:01 AM  Result Value Ref Range   Hemoglobin 11.7 (L) 13.0 - 17.0 g/dL   HCT 35.8 (L) 39.0 - 52.0 %  Glucose, capillary     Status: Abnormal   Collection Time: 11/04/15 11:33 AM  Result Value Ref Range   Glucose-Capillary 164 (H) 65 - 99 mg/dL   Comment 1 Notify RN    Comment 2 Document in Chart   Hemoglobin and hematocrit, blood     Status: Abnormal   Collection Time: 11/04/15 12:28 PM  Result Value Ref Range   Hemoglobin 11.4 (L) 13.0 - 17.0 g/dL   HCT 36.2 (L) 39.0 - 52.0 %  Glucose, capillary     Status: Abnormal   Collection Time: 11/04/15  4:16 PM  Result Value Ref Range   Glucose-Capillary 131 (H) 65 - 99 mg/dL  Hemoglobin and hematocrit, blood     Status: Abnormal   Collection Time: 11/04/15  4:41 PM  Result Value Ref Range   Hemoglobin 10.6 (L) 13.0 - 17.0 g/dL   HCT 33.1 (L) 39.0 - 52.0 %  Glucose, capillary     Status: Abnormal   Collection Time: 11/04/15  8:14 PM  Result Value Ref Range   Glucose-Capillary 139 (H) 65 - 99 mg/dL   Comment 1 Notify RN    Comment 2 Document in Chart   Hemoglobin and hematocrit, blood     Status: Abnormal   Collection Time: 11/04/15  8:20 PM  Result Value Ref Range   Hemoglobin 10.8 (L) 13.0 - 17.0 g/dL   HCT 34.0 (L) 39.0 - 52.0 %  Glucose, capillary     Status: Abnormal   Collection Time: 11/05/15 12:21 AM  Result Value Ref Range   Glucose-Capillary 118 (H) 65 - 99 mg/dL   Comment 1 Notify RN    Comment 2 Document in Chart   Glucose, capillary     Status: Abnormal   Collection Time: 11/05/15  4:18 AM  Result Value Ref Range   Glucose-Capillary 146 (H) 65 - 99 mg/dL   Comment 1 Notify RN    Comment 2 Document in Chart   Basic metabolic panel     Status: Abnormal   Collection Time: 11/05/15  4:59 AM  Result Value Ref Range   Sodium 137 135 -  145 mmol/L   Potassium 3.7 3.5 - 5.1 mmol/L    Comment: DELTA CHECK NOTED   Chloride 103 101 - 111 mmol/L   CO2 31 22 - 32 mmol/L   Glucose, Bld 160 (H) 65 - 99 mg/dL   BUN 15 6 - 20 mg/dL   Creatinine, Ser 0.48 (L) 0.61 - 1.24 mg/dL   Calcium 7.5 (L) 8.9 - 10.3 mg/dL   GFR calc non Af Amer >60 >60 mL/min   GFR calc Af Amer >60 >60  mL/min    Comment: (NOTE) The eGFR has been calculated using the CKD EPI equation. This calculation has not been validated in all clinical situations. eGFR's persistently <60 mL/min signify possible Chronic Kidney Disease.    Anion gap 3 (L) 5 - 15  CBC     Status: Abnormal   Collection Time: 11/05/15  4:59 AM  Result Value Ref Range   WBC 5.0 4.0 - 10.5 K/uL   RBC 3.79 (L) 4.22 - 5.81 MIL/uL   Hemoglobin 10.8 (L) 13.0 - 17.0 g/dL   HCT 34.5 (L) 39.0 - 52.0 %   MCV 91.0 78.0 - 100.0 fL   MCH 28.5 26.0 - 34.0 pg   MCHC 31.3 30.0 - 36.0 g/dL   RDW 17.0 (H) 11.5 - 15.5 %   Platelets 134 (L) 150 - 400 K/uL    ABGS  Recent Labs  11/04/15 0227  TCO2 28   CULTURES No results found for this or any previous visit (from the past 240 hour(s)). Studies/Results: Ct Abdomen Pelvis W Contrast  Result Date: 11/04/2015 CLINICAL DATA:  Lower abdominal crampy pain for 2 days, hematochezia at tonight. Poor historian. History of diabetes, hypertension. EXAM: CT ABDOMEN AND PELVIS WITH CONTRAST TECHNIQUE: Multidetector CT imaging of the abdomen and pelvis was performed using the standard protocol following bolus administration of intravenous contrast. CONTRAST:  129m ISOVUE-300 IOPAMIDOL (ISOVUE-300) INJECTION 61% COMPARISON:  None. FINDINGS: Altered anatomy as patient was scanned on LEFT side. LUNG BASES: Lingular and bibasilar atelectasis. Heart size is normal. No pericardial effusions. SOLID ORGANS: The liver, spleen, pancreas and adrenal glands are unremarkable. Dense layering gallbladder sludge versus tiny stones, no CT findings of acute cholecystitis.  GASTROINTESTINAL TRACT: Focally thickened, patulous duodenum with focal intramural gas and mild paraduodenal fat stranding. The stomach, large bowel are normal in course and caliber without inflammatory changes. Moderate descending and proximal sigmoid diverticulosis. Status post appendectomy. KIDNEYS/ URINARY TRACT: Kidneys are orthotopic, demonstrating symmetric enhancement. 3 mm RIGHT upper pole, 2 mm RIGHT upper pole, punctate RIGHT upper pole, 2 mm RIGHT interpolar and 5 mm RIGHT lower pole nephrolithiasis. 2 mm LEFT lower pole, punctate LEFT lower pole nephrolithiasis. No hydronephrosis or solid renal masses. The unopacified ureters are normal in course and caliber. Delayed imaging through the kidneys demonstrates symmetric prompt contrast excretion within the proximal urinary collecting system. Urinary bladder is well distended and unremarkable. PERITONEUM/RETROPERITONEUM: Aortoiliac vessels are normal in course and caliber, mild calcific atherosclerosis. No lymphadenopathy by CT size criteria. Mild prostatomegaly. RIGHT pelvic surgical clips. No intraperitoneal free fluid nor free air. SOFT TISSUE/OSSEOUS STRUCTURES: Non-suspicious. Small fat containing LEFT inguinal hernia. Ankylosis of the sacroiliac joints. Mild lumbar levoscoliosis. Mild degenerative change of the thoracolumbar spine. IMPRESSION: Findings of acute duodenum ulcer, no definite perforation and no abscess. Recommend endoscopy. Colonic diverticulosis.  Cholelithiasis. Nonobstructing bilateral nephrolithiasis. These results will be called to the ordering clinician or representative by the Radiologist Assistant, and communication documented in the PACS or zVision Dashboard. Electronically Signed   By: CElon AlasM.D.   On: 11/04/2015 05:35    Medications:  Prior to Admission:  Prescriptions Prior to Admission  Medication Sig Dispense Refill Last Dose  . albuterol (PROVENTIL HFA;VENTOLIN HFA) 108 (90 BASE) MCG/ACT inhaler Inhale 2  puffs into the lungs every 6 (six) hours as needed. Shortness of Breath   unknown at Unknown time  . canagliflozin (INVOKANA) 100 MG TABS tablet Take 3 tablets (300 mg total) by mouth daily before breakfast. 90 tablet 1 11/03/2015 at  Unknown time  . colchicine 0.6 MG tablet Take 0.6 mg by mouth daily as needed (gout).    unknown  . furosemide (LASIX) 40 MG tablet Take 40 mg by mouth daily as needed for fluid.    Past Week at Unknown time  . ipratropium-albuterol (DUONEB) 0.5-2.5 (3) MG/3ML SOLN Take 3 mLs by nebulization 4 (four) times daily as needed (shortness of breath.).   unknown  . lisinopril-hydrochlorothiazide (PRINZIDE,ZESTORETIC) 10-12.5 MG tablet TAKE ONE TABLET BY MOUTH ONCE DAILY. 90 tablet 0 11/03/2015 at Unknown time  . meclizine (ANTIVERT) 25 MG tablet Take 25 mg by mouth 3 (three) times daily as needed for dizziness.   unknown  . metFORMIN (GLUCOPHAGE) 1000 MG tablet Take 1 tablet (1,000 mg total) by mouth 2 (two) times daily with a meal. 180 tablet 3 11/03/2015 at Unknown time  . Multiple Vitamin (MULTIVITAMIN WITH MINERALS) TABS tablet Take 1 tablet by mouth daily.   11/03/2015 at Unknown time  . mupirocin ointment (BACTROBAN) 2 % Place 1 application into the nose 2 (two) times daily.   11/03/2015 at Unknown time  . Omega-3 Fatty Acids (FISH OIL PO) Take 1 capsule by mouth daily.   11/03/2015 at Unknown time  . tamsulosin (FLOMAX) 0.4 MG CAPS capsule Take 0.4 mg by mouth.   11/03/2015 at Unknown time  . Blood Glucose Monitoring Suppl (ACCU-CHEK AVIVA) device Use as instructed 1 each 0 Taking  . glucose blood (ACCU-CHEK AVIVA) test strip Use to test glucose 4 times a day. 150 each 3 Taking   Scheduled: . sodium chloride   Intravenous Once  . ciprofloxacin  400 mg Intravenous Q12H  . insulin aspart  0-9 Units Subcutaneous Q4H  . metronidazole  500 mg Intravenous Q8H   Continuous: . pantoprozole (PROTONIX) infusion 8 mg/hr (11/05/15 0400)   LTE:[id number] **OR** ondansetron (ZOFRAN)  IV  Assesment:He was admitted with GI bleeding. It appears this has stopped. His hemoglobin level has dropped but is stable now. On admission he had elevated lactate elevated heart rate and mild hypotension. He's better. I think he had some element of hypovolemic shock  There is a question of a pressure ulcer but he actually has what looks like more of a crack in the skin at his rectum not a pressure ulcer. I think this may be from shear.  He has diabetes and his blood sugar has actually been doing much better recently.  He has obesity which is unchanged  He has hypertension at baseline  He has asthma/COPD at baseline but that seems stable  He has Charcot-Marie-Tooth muscular dystrophy unchanged Principal Problem:   Rectal bleeding Active Problems:   Type II diabetes mellitus, uncontrolled ([place])   Essential hypertension, benign   Morbid obesity due to excess calories ([facility])   Lactic acidosis    Plan: Continue treatments. He is to have EGD today. Continue to monitor hemoglobin. It does not appear that he is going to need blood transfusion. He remains on treatment for colitis because I don't think the situation is totally clear at this point    LOS: 1 day   Dwayne Davies L 11/05/2015, 7:40 AM

## 2015-11-05 NOTE — Progress Notes (Signed)
  Subjective:  Patient states he feels better. Patient reports mild pain in epigastric region. He denies nausea or vomiting. He has not had a bowel movement in over 12 hours. He would like to drink fluids. He states he has difficulty breathing when he lies on his back. He has inhaler which he does not use often.  Objective: Blood pressure 116/68, pulse 82, temperature (!) 96.9 F (36.1 C), temperature source Axillary, resp. rate 19, height 5\' 4"  (1.626 m), weight 240 lb 11.9 oz (109.2 kg), SpO2 100 %. Patient is alert and in no acute distress. Abdomen is protuberant. Bowel sounds are normal. On palpation abdomen is soft with mild epigastric tenderness on deep palpation. No organomegaly or masses. No LE edema or clubbing noted.  Labs/studies Results:   Recent Labs  11/04/15 0217  11/04/15 0701  11/04/15 1641 11/04/15 2020 11/05/15 0459  WBC 5.1  --  6.0  --   --   --  5.0  HGB 12.5*  < > 11.9*  < > 10.6* 10.8* 10.8*  HCT 40.7  < > 37.1*  < > 33.1* 34.0* 34.5*  PLT 134*  --  143*  --   --   --  134*  < > = values in this interval not displayed.  BMET   Recent Labs  11/04/15 0217 11/04/15 0227 11/04/15 0701 11/05/15 0459  NA 138 141 137 137  K 4.4 4.4 4.9 3.7  CL 102 99* 106 103  CO2 29  --  24 31  GLUCOSE 217* 201* 247* 160*  BUN 17 16 19 15   CREATININE 0.54* 0.50* 0.58* 0.48*  CALCIUM 9.0  --  8.9 7.5*    LFT   Recent Labs  11/04/15 0217  PROT 7.5  ALBUMIN 3.5  AST 34  ALT 33  ALKPHOS 54  BILITOT 1.0    PT/INR   Recent Labs  11/04/15 0217  LABPROT 16.1*  INR 1.28     Assessment:  #1.GI bleed presumed to be from duodenal source but could be unrelated to this finding on CT. Patient has not required transfusion yet. He appears to have stopped bleeding. #2. CT abnormality involving the duodenum at junction of second and third part presumed to be an ulcer but could be diverticulitis. Patient is on IV Cipro and metronidazole. WBC is normal. #3. Anemia  secondary to GI bleed. #4. COPD.  Recommendations:  Diagnostic esophagogastroduodenoscopy under monitored anesthesia care later today. Procedure reviewed with the patient and he is agreeable.

## 2015-11-05 NOTE — Anesthesia Preprocedure Evaluation (Signed)
Anesthesia Evaluation  Patient identified by MRN, date of birth, ID band Patient awake    Reviewed: Allergy & Precautions, H&P , NPO status , Patient's Chart, lab work & pertinent test results  Airway Mallampati: III  TM Distance: >3 FB     Dental  (+) Poor Dentition, Chipped   Pulmonary shortness of breath, with exertion, at rest and lying, COPD,  COPD inhaler, former smoker,    + rhonchi (clkear with cough)        Cardiovascular hypertension, Pt. on medications  Rhythm:Regular Rate:Normal     Neuro/Psych Anxiety    GI/Hepatic GERD  ,  Endo/Other  diabetes, Poorly Controlled, Type obesity  Renal/GU      Musculoskeletal   Abdominal (+) + obese,   Peds  Hematology   Anesthesia Other Findings   Reproductive/Obstetrics                             Anesthesia Physical Anesthesia Plan  ASA: IV  Anesthesia Plan: MAC   Post-op Pain Management:    Induction: Intravenous  Airway Management Planned: Mask  Additional Equipment:   Intra-op Plan:   Post-operative Plan:   Informed Consent: I have reviewed the patients History and Physical, chart, labs and discussed the procedure including the risks, benefits and alternatives for the proposed anesthesia with the patient or authorized representative who has indicated his/her understanding and acceptance.     Plan Discussed with:   Anesthesia Plan Comments:         Anesthesia Quick Evaluation

## 2015-11-06 DIAGNOSIS — K264 Chronic or unspecified duodenal ulcer with hemorrhage: Secondary | ICD-10-CM

## 2015-11-06 DIAGNOSIS — D62 Acute posthemorrhagic anemia: Secondary | ICD-10-CM

## 2015-11-06 LAB — HEMOGLOBIN AND HEMATOCRIT, BLOOD
HEMATOCRIT: 26 % — AB (ref 39.0–52.0)
HEMOGLOBIN: 8.3 g/dL — AB (ref 13.0–17.0)

## 2015-11-06 LAB — CBC WITH DIFFERENTIAL/PLATELET
BASOS ABS: 0 10*3/uL (ref 0.0–0.1)
BASOS PCT: 0 %
Eosinophils Absolute: 0 10*3/uL (ref 0.0–0.7)
Eosinophils Relative: 0 %
HEMATOCRIT: 28.8 % — AB (ref 39.0–52.0)
HEMOGLOBIN: 8.9 g/dL — AB (ref 13.0–17.0)
Lymphocytes Relative: 20 %
Lymphs Abs: 1.3 10*3/uL (ref 0.7–4.0)
MCH: 28.3 pg (ref 26.0–34.0)
MCHC: 30.9 g/dL (ref 30.0–36.0)
MCV: 91.7 fL (ref 78.0–100.0)
Monocytes Absolute: 0.6 10*3/uL (ref 0.1–1.0)
Monocytes Relative: 9 %
NEUTROS ABS: 4.4 10*3/uL (ref 1.7–7.7)
NEUTROS PCT: 71 %
Platelets: 146 10*3/uL — ABNORMAL LOW (ref 150–400)
RBC: 3.14 MIL/uL — ABNORMAL LOW (ref 4.22–5.81)
RDW: 16.1 % — ABNORMAL HIGH (ref 11.5–15.5)
WBC: 6.4 10*3/uL (ref 4.0–10.5)

## 2015-11-06 LAB — GLUCOSE, CAPILLARY
GLUCOSE-CAPILLARY: 223 mg/dL — AB (ref 65–99)
Glucose-Capillary: 220 mg/dL — ABNORMAL HIGH (ref 65–99)
Glucose-Capillary: 222 mg/dL — ABNORMAL HIGH (ref 65–99)
Glucose-Capillary: 205 mg/dL — ABNORMAL HIGH (ref 65–99)
Glucose-Capillary: 210 mg/dL — ABNORMAL HIGH (ref 65–99)
Glucose-Capillary: 203 mg/dL — ABNORMAL HIGH (ref 65–99)

## 2015-11-06 LAB — BASIC METABOLIC PANEL
Anion gap: 3 — ABNORMAL LOW (ref 5–15)
BUN: 20 mg/dL (ref 6–20)
CO2: 30 mmol/L (ref 22–32)
CREATININE: 0.45 mg/dL — AB (ref 0.61–1.24)
Calcium: 7.6 mg/dL — ABNORMAL LOW (ref 8.9–10.3)
Chloride: 102 mmol/L (ref 101–111)
Glucose, Bld: 210 mg/dL — ABNORMAL HIGH (ref 65–99)
POTASSIUM: 4.3 mmol/L (ref 3.5–5.1)
SODIUM: 135 mmol/L (ref 135–145)

## 2015-11-06 LAB — H. PYLORI ANTIBODY, IGG

## 2015-11-06 MED ORDER — TAMSULOSIN HCL 0.4 MG PO CAPS
0.4000 mg | ORAL_CAPSULE | Freq: Every day | ORAL | Status: DC
  Administered 2015-11-07 – 2015-11-08 (×3): 0.4 mg via ORAL
  Filled 2015-11-06 (×3): qty 1

## 2015-11-06 MED ORDER — IPRATROPIUM-ALBUTEROL 0.5-2.5 (3) MG/3ML IN SOLN: 3.0000 mL | Freq: Four times a day (QID) | RESPIRATORY_TRACT | Status: DC | PRN

## 2015-11-06 MED ORDER — INSULIN ASPART 100 UNIT/ML ~~LOC~~ SOLN
0.0000 [IU] | Freq: Three times a day (TID) | SUBCUTANEOUS | Status: DC
  Administered 2015-11-06 – 2015-11-07 (×3): 5 [IU] via SUBCUTANEOUS
  Administered 2015-11-07: 8 [IU] via SUBCUTANEOUS
  Administered 2015-11-08 (×2): 5 [IU] via SUBCUTANEOUS
  Administered 2015-11-08 – 2015-11-09 (×2): 3 [IU] via SUBCUTANEOUS

## 2015-11-06 MED ORDER — POLYSACCHARIDE IRON COMPLEX 150 MG PO CAPS
150.0000 mg | ORAL_CAPSULE | Freq: Every day | ORAL | Status: DC
  Administered 2015-11-06 – 2015-11-07 (×2): 150 mg via ORAL
  Filled 2015-11-06 (×2): qty 1

## 2015-11-06 NOTE — Progress Notes (Signed)
Subjective: He says he feels okay. No abdominal pain but he did have some dark bloody stools last night. No bright red blood. His hemoglobin level has gone down some now.  Objective: Vital signs in last 24 hours: Temp:  [97.9 F (36.6 C)-99.2 F (37.3 C)] 97.9 F (36.6 C) (08/05 0830) Pulse Rate:  [91-115] 109 (08/05 0811) Resp:  [16-31] 23 (08/05 0811) BP: (104-161)/(68-134) 107/81 (08/05 0811) SpO2:  [93 %-100 %] 100 % (08/05 0811) Weight:  [106.8 kg (235 lb 7.2 oz)] 106.8 kg (235 lb 7.2 oz) (08/05 0400) Weight change: -2.4 kg (-5 lb 4.7 oz) Last BM Date: 11/06/15  Intake/Output from previous day: 08/04 0701 - 08/05 0700 In: 1672.9 [P.O.:240; I.V.:1232.9; IV Piggyback:200] Out: 2260 [Urine:2250; Blood:10]  PHYSICAL EXAM General appearance: alert, cooperative and mild distress Resp: clear to auscultation bilaterally Cardio: regular rate and rhythm, S1, S2 normal, no murmur, click, rub or gallop GI: Minimal tenderness epigastric region 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  Lab Results:  Results for orders placed or performed during the hospital encounter of 11/04/15 (from the past 48 hour(s))  Glucose, capillary     Status: Abnormal   Collection Time: 11/04/15 11:33 AM  Result Value Ref Range   Glucose-Capillary 164 (H) 65 - 99 mg/dL   Comment 1 Notify RN    Comment 2 Document in Chart   Hemoglobin and hematocrit, blood     Status: Abnormal   Collection Time: 11/04/15 12:28 PM  Result Value Ref Range   Hemoglobin 11.4 (L) 13.0 - 17.0 g/dL   HCT 36.2 (L) 39.0 - 52.0 %  Glucose, capillary     Status: Abnormal   Collection Time: 11/04/15  4:16 PM  Result Value Ref Range   Glucose-Capillary 131 (H) 65 - 99 mg/dL  Hemoglobin and hematocrit, blood     Status: Abnormal   Collection Time: 11/04/15  4:41 PM  Result Value Ref Range   Hemoglobin 10.6 (L) 13.0 - 17.0 g/dL   HCT 33.1 (L) 39.0 - 52.0 %  Glucose, capillary     Status: Abnormal   Collection Time: 11/04/15  8:14 PM  Result Value Ref Range   Glucose-Capillary 139 (H) 65 - 99 mg/dL   Comment 1 Notify RN    Comment 2 Document in Chart   Hemoglobin and hematocrit, blood     Status: Abnormal   Collection Time: 11/04/15  8:20 PM  Result Value Ref Range   Hemoglobin 10.8 (L) 13.0 - 17.0 g/dL   HCT 34.0 (L) 39.0 - 52.0 %  Glucose, capillary     Status: Abnormal   Collection Time: 11/05/15 12:21 AM  Result Value Ref Range   Glucose-Capillary 118 (H) 65 - 99 mg/dL   Comment 1 Notify RN    Comment 2 Document in Chart   Glucose, capillary     Status: Abnormal   Collection Time: 11/05/15  4:18 AM  Result Value Ref Range   Glucose-Capillary 146 (H) 65 - 99 mg/dL   Comment 1 Notify RN    Comment 2 Document in Chart   Basic metabolic panel     Status: Abnormal   Collection Time: 11/05/15  4:59 AM  Result Value Ref Range   Sodium 137 135 - 145 mmol/L   Potassium 3.7 3.5 - 5.1 mmol/L    Comment: DELTA CHECK NOTED   Chloride 103 101 - 111 mmol/L   CO2 31 22 - 32 mmol/L   Glucose, Bld 160 (H) 65 - 99 mg/dL  BUN 15 6 - 20 mg/dL   Creatinine, Ser 0.48 (L) 0.61 - 1.24 mg/dL   Calcium 7.5 (L) 8.9 - 10.3 mg/dL   GFR calc non Af Amer >60 >60 mL/min   GFR calc Af Amer >60 >60 mL/min    Comment: (NOTE) The eGFR has been calculated using the CKD EPI equation. This calculation has not been validated in all clinical situations. eGFR's persistently <60 mL/min signify possible Chronic Kidney Disease.    Anion gap 3 (L) 5 - 15  CBC     Status: Abnormal   Collection Time: 11/05/15  4:59 AM  Result Value Ref Range   WBC 5.0 4.0 - 10.5 K/uL   RBC 3.79 (L) 4.22 - 5.81 MIL/uL   Hemoglobin 10.8 (L) 13.0 - 17.0 g/dL   HCT 34.5 (L) 39.0 - 52.0 %   MCV 91.0 78.0 - 100.0 fL   MCH 28.5 26.0 - 34.0 pg   MCHC 31.3 30.0 - 36.0 g/dL   RDW 17.0 (H) 11.5 - 15.5 %   Platelets 134 (L) 150 - 400 K/uL  Glucose, capillary     Status: Abnormal   Collection Time: 11/05/15  7:30 AM  Result  Value Ref Range   Glucose-Capillary 142 (H) 65 - 99 mg/dL   Comment 1 Notify RN    Comment 2 Document in Chart   Glucose, capillary     Status: Abnormal   Collection Time: 11/05/15  9:17 AM  Result Value Ref Range   Glucose-Capillary 143 (H) 65 - 99 mg/dL  Glucose, capillary     Status: Abnormal   Collection Time: 11/05/15 11:04 AM  Result Value Ref Range   Glucose-Capillary 180 (H) 65 - 99 mg/dL  Glucose, capillary     Status: Abnormal   Collection Time: 11/05/15 11:51 AM  Result Value Ref Range   Glucose-Capillary 185 (H) 65 - 99 mg/dL   Comment 1 Notify RN    Comment 2 Document in Chart   Glucose, capillary     Status: Abnormal   Collection Time: 11/05/15  3:20 PM  Result Value Ref Range   Glucose-Capillary 180 (H) 65 - 99 mg/dL   Comment 1 Notify RN   Hemoglobin and hematocrit, blood     Status: Abnormal   Collection Time: 11/05/15  3:54 PM  Result Value Ref Range   Hemoglobin 10.8 (L) 13.0 - 17.0 g/dL   HCT 34.1 (L) 39.0 - 52.0 %  Glucose, capillary     Status: Abnormal   Collection Time: 11/05/15  8:39 PM  Result Value Ref Range   Glucose-Capillary 215 (H) 65 - 99 mg/dL   Comment 1 Notify RN    Comment 2 Document in Chart   Glucose, capillary     Status: Abnormal   Collection Time: 11/06/15 12:10 AM  Result Value Ref Range   Glucose-Capillary 223 (H) 65 - 99 mg/dL   Comment 1 Notify RN    Comment 2 Document in Chart   Glucose, capillary     Status: Abnormal   Collection Time: 11/06/15  4:31 AM  Result Value Ref Range   Glucose-Capillary 220 (H) 65 - 99 mg/dL   Comment 1 Notify RN    Comment 2 Document in Chart   CBC with Differential/Platelet     Status: Abnormal   Collection Time: 11/06/15  5:06 AM  Result Value Ref Range   WBC 6.4 4.0 - 10.5 K/uL   RBC 3.14 (L) 4.22 - 5.81 MIL/uL   Hemoglobin 8.9 (  L) 13.0 - 17.0 g/dL   HCT 28.8 (L) 39.0 - 52.0 %   MCV 91.7 78.0 - 100.0 fL   MCH 28.3 26.0 - 34.0 pg   MCHC 30.9 30.0 - 36.0 g/dL   RDW 16.1 (H) 11.5 - 15.5  %   Platelets 146 (L) 150 - 400 K/uL   Neutrophils Relative % 71 %   Neutro Abs 4.4 1.7 - 7.7 K/uL   Lymphocytes Relative 20 %   Lymphs Abs 1.3 0.7 - 4.0 K/uL   Monocytes Relative 9 %   Monocytes Absolute 0.6 0.1 - 1.0 K/uL   Eosinophils Relative 0 %   Eosinophils Absolute 0.0 0.0 - 0.7 K/uL   Basophils Relative 0 %   Basophils Absolute 0.0 0.0 - 0.1 K/uL  Basic metabolic panel     Status: Abnormal   Collection Time: 11/06/15  5:06 AM  Result Value Ref Range   Sodium 135 135 - 145 mmol/L   Potassium 4.3 3.5 - 5.1 mmol/L   Chloride 102 101 - 111 mmol/L   CO2 30 22 - 32 mmol/L   Glucose, Bld 210 (H) 65 - 99 mg/dL   BUN 20 6 - 20 mg/dL   Creatinine, Ser 0.45 (L) 0.61 - 1.24 mg/dL   Calcium 7.6 (L) 8.9 - 10.3 mg/dL   GFR calc non Af Amer >60 >60 mL/min   GFR calc Af Amer >60 >60 mL/min    Comment: (NOTE) The eGFR has been calculated using the CKD EPI equation. This calculation has not been validated in all clinical situations. eGFR's persistently <60 mL/min signify possible Chronic Kidney Disease.    Anion gap 3 (L) 5 - 15  Glucose, capillary     Status: Abnormal   Collection Time: 11/06/15  7:26 AM  Result Value Ref Range   Glucose-Capillary 222 (H) 65 - 99 mg/dL    ABGS  Recent Labs  11/04/15 0227  TCO2 28   CULTURES No results found for this or any previous visit (from the past 240 hour(s)). Studies/Results: No results found.  Medications:  Prior to Admission:  Prescriptions Prior to Admission  Medication Sig Dispense Refill Last Dose  . albuterol (PROVENTIL HFA;VENTOLIN HFA) 108 (90 BASE) MCG/ACT inhaler Inhale 2 puffs into the lungs every 6 (six) hours as needed. Shortness of Breath   unknown at Unknown time  . canagliflozin (INVOKANA) 100 MG TABS tablet Take 3 tablets (300 mg total) by mouth daily before breakfast. 90 tablet 1 11/03/2015 at Unknown time  . colchicine 0.6 MG tablet Take 0.6 mg by mouth daily as needed (gout).    unknown  . furosemide (LASIX) 40  MG tablet Take 40 mg by mouth daily as needed for fluid.    Past Week at Unknown time  . ipratropium-albuterol (DUONEB) 0.5-2.5 (3) MG/3ML SOLN Take 3 mLs by nebulization 4 (four) times daily as needed (shortness of breath.).   unknown  . lisinopril-hydrochlorothiazide (PRINZIDE,ZESTORETIC) 10-12.5 MG tablet TAKE ONE TABLET BY MOUTH ONCE DAILY. 90 tablet 0 11/03/2015 at Unknown time  . meclizine (ANTIVERT) 25 MG tablet Take 25 mg by mouth 3 (three) times daily as needed for dizziness.   unknown  . metFORMIN (GLUCOPHAGE) 1000 MG tablet Take 1 tablet (1,000 mg total) by mouth 2 (two) times daily with a meal. 180 tablet 3 11/03/2015 at Unknown time  . Multiple Vitamin (MULTIVITAMIN WITH MINERALS) TABS tablet Take 1 tablet by mouth daily.   11/03/2015 at Unknown time  . mupirocin ointment (BACTROBAN) 2 %  Place 1 application into the nose 2 (two) times daily.   11/03/2015 at Unknown time  . Omega-3 Fatty Acids (FISH OIL PO) Take 1 capsule by mouth daily.   11/03/2015 at Unknown time  . tamsulosin (FLOMAX) 0.4 MG CAPS capsule Take 0.4 mg by mouth.   11/03/2015 at Unknown time  . Blood Glucose Monitoring Suppl (ACCU-CHEK AVIVA) device Use as instructed 1 each 0 Taking  . glucose blood (ACCU-CHEK AVIVA) test strip Use to test glucose 4 times a day. 150 each 3 Taking   Scheduled: . ciprofloxacin  400 mg Intravenous Q12H  . insulin aspart  0-9 Units Subcutaneous Q4H  . metronidazole  500 mg Intravenous Q8H  . sucralfate  1 g Oral TID WC & HS   Continuous: . sodium chloride 75 mL/hr at 11/05/15 2325  . pantoprozole (PROTONIX) infusion 8 mg/hr (11/06/15 0143)   OYO:[id number] **OR** ondansetron (ZOFRAN) IV  Assesment: He was admitted with rectal bleeding. He has peptic ulcer disease. This is being treated. He has improved. He still had some blood last night but I think it's probably old. I think he has now become hemodynamically stable and were seeing his reduction in hemoglobin level. He is not at a level that he  needs blood transfusion right now. He has hypertension and his blood pressure is up some but he also has some fairly low blood pressures so I'm not going to restart his medications yet. Principal Problem:   Rectal bleeding Active Problems:   Type II diabetes mellitus, uncontrolled ([place])   Essential hypertension, benign   Morbid obesity due to excess calories (HCC)   Lactic acidosis   Hypovolemic shock ([facility])   Acute blood loss anemia   Tear of skin of buttock    Plan: Continue full liquid diet. Start iron. Transfer from ICU.    LOS: 2 days   Tavion Senkbeil L 11/06/2015, 9:43 AM

## 2015-11-06 NOTE — Progress Notes (Signed)
  Subjective:  Patient has no complaints. He denies abdominal pain. He also denies nausea and vomiting. He is hungry. Last bowel movement was over our ago. and he passed tarry stool with small amount of blood.  Objective: Blood pressure 107/81, pulse (!) 109, temperature 97.9 F (36.6 C), temperature source Axillary, resp. rate (!) 23, height 5\' 4"  (1.626 m), weight 235 lb 7.2 oz (106.8 kg), SpO2 100 %. Patient is alert and in no acute distress. Auscultation of lungs revealed visit low breast sounds with diminished intensity. Abdomen is protuberant with normal bowel sounds. Is soft and nontender.  Labs/studies Results:   Recent Labs  11/04/15 0701  11/05/15 0459 11/05/15 1554 11/06/15 0506  WBC 6.0  --  5.0  --  6.4  HGB 11.9*  < > 10.8* 10.8* 8.9*  HCT 37.1*  < > 34.5* 34.1* 28.8*  PLT 143*  --  134*  --  146*  < > = values in this interval not displayed.  BMET   Recent Labs  11/04/15 0701 11/05/15 0459 11/06/15 0506  NA 137 137 135  K 4.9 3.7 4.3  CL 106 103 102  CO2 24 31 30   GLUCOSE 247* 160* 210*  BUN 19 15 20   CREATININE 0.58* 0.48* 0.45*  CALCIUM 8.9 7.5* 7.6*    LFT   Recent Labs  11/04/15 0217  PROT 7.5  ALBUMIN 3.5  AST 34  ALT 33  ALKPHOS 54  BILITOT 1.0    PT/INR   Recent Labs  11/04/15 0217  LABPROT 16.1*  INR 1.28    H pylori serology is negative.    Assessment:  #1. Upper GI bleed secondary to duodenal ulcer involving second part of the duodenum. Status post therapeutic endoscopy yesterday. He is hemodynamically stable. He is well hydrated. Renal function is normal. Given that H. pylori serology is negative as ulcer disease has to be secondary to NSAID use. He appears to be passing old blood. #2. Anemia secondary to GI bleed. Given the amount of blood loss I would have expected even greater drop. So far he has not required blood transfusion.  Recommendations:  Advance diet to modify her carb diet. Continue pantoprazole infusion  for another 24 hours. Continue IV antibiotics for another 24 hours.

## 2015-11-06 NOTE — Progress Notes (Signed)
Report to Val RN to assume care of patient at transfer to 324, son aware of transfer. All belongings sent with patient. Message related to Val that Dr. Rehman wants to be contacted after lunch to be notified about amount of intake and toleration of meal.  

## 2015-11-06 NOTE — Progress Notes (Signed)
Pt has had a couple of bouts of nausea and about 5 watery dark bloody stools throughout the night.

## 2015-11-07 LAB — CBC WITH DIFFERENTIAL/PLATELET
BASOS ABS: 0 10*3/uL (ref 0.0–0.1)
BASOS PCT: 1 %
EOS ABS: 0.1 10*3/uL (ref 0.0–0.7)
EOS PCT: 3 %
HCT: 24.6 % — ABNORMAL LOW (ref 39.0–52.0)
Hemoglobin: 7.8 g/dL — ABNORMAL LOW (ref 13.0–17.0)
LYMPHS PCT: 39 %
Lymphs Abs: 1.5 10*3/uL (ref 0.7–4.0)
MCH: 28.4 pg (ref 26.0–34.0)
MCHC: 31.7 g/dL (ref 30.0–36.0)
MCV: 89.5 fL (ref 78.0–100.0)
MONO ABS: 0.4 10*3/uL (ref 0.1–1.0)
Monocytes Relative: 10 %
Neutro Abs: 1.8 10*3/uL (ref 1.7–7.7)
Neutrophils Relative %: 47 %
PLATELETS: 128 10*3/uL — AB (ref 150–400)
RBC: 2.75 MIL/uL — ABNORMAL LOW (ref 4.22–5.81)
RDW: 16.7 % — AB (ref 11.5–15.5)
WBC: 3.8 10*3/uL — AB (ref 4.0–10.5)

## 2015-11-07 LAB — BASIC METABOLIC PANEL
ANION GAP: 3 — AB (ref 5–15)
BUN: 15 mg/dL (ref 6–20)
CHLORIDE: 102 mmol/L (ref 101–111)
CO2: 29 mmol/L (ref 22–32)
CREATININE: 0.41 mg/dL — AB (ref 0.61–1.24)
Calcium: 7.3 mg/dL — ABNORMAL LOW (ref 8.9–10.3)
GFR calc Af Amer: >60 mL/min (ref >60)
GFR calc non Af Amer: >60 mL/min (ref >60)
GLUCOSE: 202 mg/dL — AB (ref 65–99)
Potassium: 3.7 mmol/L (ref 3.5–5.1)
SODIUM: 134 mmol/L — AB (ref 135–145)

## 2015-11-07 LAB — MRSA PCR SCREENING: MRSA BY PCR: NEGATIVE

## 2015-11-07 LAB — GLUCOSE, CAPILLARY
GLUCOSE-CAPILLARY: 248 mg/dL — AB (ref 65–99)
GLUCOSE-CAPILLARY: 248 mg/dL — AB (ref 65–99)
Glucose-Capillary: 242 mg/dL — ABNORMAL HIGH (ref 65–99)
Glucose-Capillary: 260 mg/dL — ABNORMAL HIGH (ref 65–99)

## 2015-11-07 NOTE — Progress Notes (Signed)
Subjective: He says he feels better. No new complaints. He said he had a little bit of dark stool. He is having some nausea  Objective: Vital signs in last 24 hours: Temp:  [97.9 F (36.6 C)-100.1 F (37.8 C)] 98.8 F (37.1 C) (08/06 0634) Pulse Rate:  [96-108] 105 (08/06 0634) Resp:  [18-22] 20 (08/06 0634) BP: (114-128)/(45-69) 121/63 (08/06 0634) SpO2:  [95 %-100 %] 96 % (08/06 0634) Weight:  [108.6 kg (239 lb 6.7 oz)] 108.6 kg (239 lb 6.7 oz) (08/05 1553) Weight change: 1.8 kg (3 lb 15.5 oz) Last BM Date: 11/06/15  Intake/Output from previous day: 08/05 0701 - 08/06 0700 In: 2316.3 [P.O.:1320; I.V.:996.3] Out: 400 [Urine:400]  PHYSICAL EXAM General appearance: alert, cooperative and no distress Resp: clear to auscultation bilaterally Cardio: regular rate and rhythm, S1, S2 normal, no murmur, click, rub or gallop GI: soft, non-tender; bowel sounds normal; no masses,  no organomegaly ExtremitieHe has changes of Charcot-Marie-Tooth muscular dystrophyHe has changes of Charcot-Marie-Tooth muscular dystrophy  Lab Results:  Results for orders placed or performed during the hospital encounter of 11/04/15 (from the past 48 hour(s))  Glucose, capillary     Status: Abnormal   Collection Time: 11/05/15  9:17 AM  Result Value Ref Range   Glucose-Capillary 143 (H) 65 - 99 mg/dL  Glucose, capillary     Status: Abnormal   Collection Time: 11/05/15 11:04 AM  Result Value Ref Range   Glucose-Capillary 180 (H) 65 - 99 mg/dL  Glucose, capillary     Status: Abnormal   Collection Time: 11/05/15 11:51 AM  Result Value Ref Range   Glucose-Capillary 185 (H) 65 - 99 mg/dL   Comment 1 Notify RN    Comment 2 Document in Chart   Glucose, capillary     Status: Abnormal   Collection Time: 11/05/15  3:20 PM  Result Value Ref Range   Glucose-Capillary 180 (H) 65 - 99 mg/dL   Comment 1 Notify RN   Hemoglobin and hematocrit, blood     Status: Abnormal   Collection Time: 11/05/15  3:54 PM   Result Value Ref Range   Hemoglobin 10.8 (L) 13.0 - 17.0 g/dL   HCT 34.1 (L) 39.0 - 52.0 %  H. pylori antibody, IgG     Status: None   Collection Time: 11/05/15  3:54 PM  Result Value Ref Range   H Pylori IgG <0.9 0.0 - 0.8 U/mL    Comment: (NOTE)                             Negative            <0.9                             Indeterminate  0.9 - 1.0                             Positive            >1.0 Performed At: [facility] [place], [place] [id number] Lindon Romp MD [id number]   Glucose, capillary     Status: Abnormal   Collection Time: 11/05/15  8:39 PM  Result Value Ref Range   Glucose-Capillary 215 (H) 65 - 99 mg/dL   Comment 1 Notify RN    Comment 2 Document in Chart   Glucose, capillary  Status: Abnormal   Collection Time: 11/06/15 12:10 AM  Result Value Ref Range   Glucose-Capillary 223 (H) 65 - 99 mg/dL   Comment 1 Notify RN    Comment 2 Document in Chart   Glucose, capillary     Status: Abnormal   Collection Time: 11/06/15  4:31 AM  Result Value Ref Range   Glucose-Capillary 220 (H) 65 - 99 mg/dL   Comment 1 Notify RN    Comment 2 Document in Chart   CBC with Differential/Platelet     Status: Abnormal   Collection Time: 11/06/15  5:06 AM  Result Value Ref Range   WBC 6.4 4.0 - 10.5 K/uL   RBC 3.14 (L) 4.22 - 5.81 MIL/uL   Hemoglobin 8.9 (L) 13.0 - 17.0 g/dL   HCT 28.8 (L) 39.0 - 52.0 %   MCV 91.7 78.0 - 100.0 fL   MCH 28.3 26.0 - 34.0 pg   MCHC 30.9 30.0 - 36.0 g/dL   RDW 16.1 (H) 11.5 - 15.5 %   Platelets 146 (L) 150 - 400 K/uL   Neutrophils Relative % 71 %   Neutro Abs 4.4 1.7 - 7.7 K/uL   Lymphocytes Relative 20 %   Lymphs Abs 1.3 0.7 - 4.0 K/uL   Monocytes Relative 9 %   Monocytes Absolute 0.6 0.1 - 1.0 K/uL   Eosinophils Relative 0 %   Eosinophils Absolute 0.0 0.0 - 0.7 K/uL   Basophils Relative 0 %   Basophils Absolute 0.0 0.0 - 0.1 K/uL  Basic metabolic panel     Status: Abnormal   Collection Time:  11/06/15  5:06 AM  Result Value Ref Range   Sodium 135 135 - 145 mmol/L   Potassium 4.3 3.5 - 5.1 mmol/L   Chloride 102 101 - 111 mmol/L   CO2 30 22 - 32 mmol/L   Glucose, Bld 210 (H) 65 - 99 mg/dL   BUN 20 6 - 20 mg/dL   Creatinine, Ser 0.45 (L) 0.61 - 1.24 mg/dL   Calcium 7.6 (L) 8.9 - 10.3 mg/dL   GFR calc non Af Amer >60 >60 mL/min   GFR calc Af Amer >60 >60 mL/min    Comment: (NOTE) The eGFR has been calculated using the CKD EPI equation. This calculation has not been validated in all clinical situations. eGFR's persistently <60 mL/min signify possible Chronic Kidney Disease.    Anion gap 3 (L) 5 - 15  Glucose, capillary     Status: Abnormal   Collection Time: 11/06/15  7:26 AM  Result Value Ref Range   Glucose-Capillary 222 (H) 65 - 99 mg/dL  Glucose, capillary     Status: Abnormal   Collection Time: 11/06/15 11:42 AM  Result Value Ref Range   Glucose-Capillary 205 (H) 65 - 99 mg/dL  Glucose, capillary     Status: Abnormal   Collection Time: 11/06/15  4:41 PM  Result Value Ref Range   Glucose-Capillary 210 (H) 65 - 99 mg/dL  Hemoglobin and hematocrit, blood     Status: Abnormal   Collection Time: 11/06/15  7:24 PM  Result Value Ref Range   Hemoglobin 8.3 (L) 13.0 - 17.0 g/dL   HCT 26.0 (L) 39.0 - 52.0 %  Glucose, capillary     Status: Abnormal   Collection Time: 11/06/15  9:15 PM  Result Value Ref Range   Glucose-Capillary 203 (H) 65 - 99 mg/dL  CBC with Differential/Platelet     Status: Abnormal   Collection Time: 11/07/15  5:57 AM  Result Value Ref Range   WBC 3.8 (L) 4.0 - 10.5 K/uL   RBC 2.75 (L) 4.22 - 5.81 MIL/uL   Hemoglobin 7.8 (L) 13.0 - 17.0 g/dL   HCT 24.6 (L) 39.0 - 52.0 %   MCV 89.5 78.0 - 100.0 fL   MCH 28.4 26.0 - 34.0 pg   MCHC 31.7 30.0 - 36.0 g/dL   RDW 16.7 (H) 11.5 - 15.5 %   Platelets 128 (L) 150 - 400 K/uL   Neutrophils Relative % 47 %   Neutro Abs 1.8 1.7 - 7.7 K/uL   Lymphocytes Relative 39 %   Lymphs Abs 1.5 0.7 - 4.0 K/uL    Monocytes Relative 10 %   Monocytes Absolute 0.4 0.1 - 1.0 K/uL   Eosinophils Relative 3 %   Eosinophils Absolute 0.1 0.0 - 0.7 K/uL   Basophils Relative 1 %   Basophils Absolute 0.0 0.0 - 0.1 K/uL  Basic metabolic panel     Status: Abnormal   Collection Time: 11/07/15  5:57 AM  Result Value Ref Range   Sodium 134 (L) 135 - 145 mmol/L   Potassium 3.7 3.5 - 5.1 mmol/L   Chloride 102 101 - 111 mmol/L   CO2 29 22 - 32 mmol/L   Glucose, Bld 202 (H) 65 - 99 mg/dL   BUN 15 6 - 20 mg/dL   Creatinine, Ser 0.41 (L) 0.61 - 1.24 mg/dL   Calcium 7.3 (L) 8.9 - 10.3 mg/dL   GFR calc non Af Amer >60 >60 mL/min   GFR calc Af Amer >60 >60 mL/min    Comment: (NOTE) The eGFR has been calculated using the CKD EPI equation. This calculation has not been validated in all clinical situations. eGFR's persistently <60 mL/min signify possible Chronic Kidney Disease.    Anion gap 3 (L) 5 - 15  Glucose, capillary     Status: Abnormal   Collection Time: 11/07/15  7:25 AM  Result Value Ref Range   Glucose-Capillary 242 (H) 65 - 99 mg/dL    ABGS No results for input(s): PHART, PO2ART, TCO2, HCO3 in the last 72 hours.  Invalid input(s): PCO2 CULTURES No results found for this or any previous visit (from the past 240 hour(s)). Studies/Results: No results found.  Medications:  Prior to Admission:  Prescriptions Prior to Admission  Medication Sig Dispense Refill Last Dose  . albuterol (PROVENTIL HFA;VENTOLIN HFA) 108 (90 BASE) MCG/ACT inhaler Inhale 2 puffs into the lungs every 6 (six) hours as needed. Shortness of Breath   unknown at Unknown time  . canagliflozin (INVOKANA) 100 MG TABS tablet Take 3 tablets (300 mg total) by mouth daily before breakfast. 90 tablet 1 11/03/2015 at Unknown time  . colchicine 0.6 MG tablet Take 0.6 mg by mouth daily as needed (gout).    unknown  . furosemide (LASIX) 40 MG tablet Take 40 mg by mouth daily as needed for fluid.    Past Week at Unknown time  .  ipratropium-albuterol (DUONEB) 0.5-2.5 (3) MG/3ML SOLN Take 3 mLs by nebulization 4 (four) times daily as needed (shortness of breath.).   unknown  . lisinopril-hydrochlorothiazide (PRINZIDE,ZESTORETIC) 10-12.5 MG tablet TAKE ONE TABLET BY MOUTH ONCE DAILY. 90 tablet 0 11/03/2015 at Unknown time  . meclizine (ANTIVERT) 25 MG tablet Take 25 mg by mouth 3 (three) times daily as needed for dizziness.   unknown  . metFORMIN (GLUCOPHAGE) 1000 MG tablet Take 1 tablet (1,000 mg total) by mouth 2 (two) times daily with a meal. 180 tablet  3 11/03/2015 at Unknown time  . Multiple Vitamin (MULTIVITAMIN WITH MINERALS) TABS tablet Take 1 tablet by mouth daily.   11/03/2015 at Unknown time  . mupirocin ointment (BACTROBAN) 2 % Place 1 application into the nose 2 (two) times daily.   11/03/2015 at Unknown time  . Omega-3 Fatty Acids (FISH OIL PO) Take 1 capsule by mouth daily.   11/03/2015 at Unknown time  . tamsulosin (FLOMAX) 0.4 MG CAPS capsule Take 0.4 mg by mouth.   11/03/2015 at Unknown time  . Blood Glucose Monitoring Suppl (ACCU-CHEK AVIVA) device Use as instructed 1 each 0 Taking  . glucose blood (ACCU-CHEK AVIVA) test strip Use to test glucose 4 times a day. 150 each 3 Taking   Scheduled: . ciprofloxacin  400 mg Intravenous Q12H  . insulin aspart  0-15 Units Subcutaneous TID WC  . iron polysaccharides  150 mg Oral Daily  . metronidazole  500 mg Intravenous Q8H  . sucralfate  1 g Oral TID WC & HS  . tamsulosin  0.4 mg Oral QPC supper   Continuous: . sodium chloride 75 mL/hr at 11/06/15 1541   AJL:[id number], ondansetron **OR** ondansetron (ZOFRAN) IV  Assesment:He was admitted with GI bleeding and had hypovolemic shock. He had acute blood loss anemia but has not required a blood transfusion. He is generally improving. Principal Problem:   Rectal bleeding Active Problems:   Type II diabetes mellitus, uncontrolled ([place])   Essential hypertension, benign   Morbid obesity due to excess calories  ([facility])   Lactic acidosis   Hypovolemic shock ([facility])   Acute blood loss anemia   Tear of skin of buttock    Plan: Continue treatments. Possible discharge tomorrow. Recheck CBC tomorrow    LOS: 3 days   Dwayne Davies L 11/07/2015, 8:19 AM

## 2015-11-08 ENCOUNTER — Inpatient Hospital Stay (HOSPITAL_COMMUNITY): Payer: Medicare Other | Admitting: Anesthesiology

## 2015-11-08 ENCOUNTER — Encounter (HOSPITAL_COMMUNITY): Payer: Self-pay | Admitting: Anesthesiology

## 2015-11-08 ENCOUNTER — Encounter (HOSPITAL_COMMUNITY)
Admission: EM | Disposition: A | Discharge status: Home / Self Care | Payer: Self-pay | Source: Home / Self Care | Attending: Pulmonary Disease

## 2015-11-08 DIAGNOSIS — K3189 Other diseases of stomach and duodenum: Secondary | ICD-10-CM

## 2015-11-08 DIAGNOSIS — K2980 Duodenitis without bleeding: Secondary | ICD-10-CM

## 2015-11-08 DIAGNOSIS — K2970 Gastritis, unspecified, without bleeding: Secondary | ICD-10-CM

## 2015-11-08 DIAGNOSIS — K449 Diaphragmatic hernia without obstruction or gangrene: Secondary | ICD-10-CM

## 2015-11-08 DIAGNOSIS — K766 Portal hypertension: Secondary | ICD-10-CM

## 2015-11-08 HISTORY — PX: ESOPHAGOGASTRODUODENOSCOPY (EGD) WITH PROPOFOL: SHX5813

## 2015-11-08 LAB — CBC WITH DIFFERENTIAL/PLATELET
Basophils Absolute: 0 10*3/uL (ref 0.0–0.1)
Basophils Relative: 0 %
Eosinophils Absolute: 0 10*3/uL (ref 0.0–0.7)
Eosinophils Relative: 0 %
HEMATOCRIT: 19.4 % — AB (ref 39.0–52.0)
HEMOGLOBIN: 6.2 g/dL — AB (ref 13.0–17.0)
LYMPHS ABS: 2 10*3/uL (ref 0.7–4.0)
Lymphocytes Relative: 29 %
MCH: 28.6 pg (ref 26.0–34.0)
MCHC: 32 g/dL (ref 30.0–36.0)
MCV: 89.4 fL (ref 78.0–100.0)
MONOS PCT: 11 %
Monocytes Absolute: 0.8 10*3/uL (ref 0.1–1.0)
NEUTROS ABS: 4.1 10*3/uL (ref 1.7–7.7)
NEUTROS PCT: 59 %
Platelets: 169 10*3/uL (ref 150–400)
RBC: 2.17 MIL/uL — ABNORMAL LOW (ref 4.22–5.81)
RDW: 17.4 % — ABNORMAL HIGH (ref 11.5–15.5)
WBC: 6.9 10*3/uL (ref 4.0–10.5)

## 2015-11-08 LAB — GLUCOSE, CAPILLARY
GLUCOSE-CAPILLARY: 205 mg/dL — AB (ref 65–99)
GLUCOSE-CAPILLARY: 208 mg/dL — AB (ref 65–99)
GLUCOSE-CAPILLARY: 196 mg/dL — AB (ref 65–99)
GLUCOSE-CAPILLARY: 185 mg/dL — AB (ref 65–99)
Glucose-Capillary: 185 mg/dL — ABNORMAL HIGH (ref 65–99)
Glucose-Capillary: 173 mg/dL — ABNORMAL HIGH (ref 65–99)

## 2015-11-08 LAB — HEMOGLOBIN AND HEMATOCRIT, BLOOD
HCT: 25.7 % — ABNORMAL LOW (ref 39.0–52.0)
Hemoglobin: 8.4 g/dL — ABNORMAL LOW (ref 13.0–17.0)

## 2015-11-08 LAB — PREPARE RBC (CROSSMATCH)

## 2015-11-08 SURGERY — ESOPHAGOGASTRODUODENOSCOPY (EGD) WITH PROPOFOL
Anesthesia: Monitor Anesthesia Care

## 2015-11-08 MED ORDER — ACETAMINOPHEN 325 MG PO TABS
650.0000 mg | ORAL_TABLET | Freq: Once | ORAL | Status: AC
  Administered 2015-11-08: 650 mg via ORAL
  Filled 2015-11-08: qty 2

## 2015-11-08 MED ORDER — LACTATED RINGERS IV SOLN
INTRAVENOUS | Status: DC
  Administered 2015-11-08: 14:00:00 via INTRAVENOUS

## 2015-11-08 MED ORDER — FENTANYL CITRATE (PF) 100 MCG/2ML IJ SOLN
25.0000 ug | INTRAMUSCULAR | Status: DC | PRN
  Administered 2015-11-08: 25 ug via INTRAVENOUS

## 2015-11-08 MED ORDER — FENTANYL CITRATE (PF) 100 MCG/2ML IJ SOLN
INTRAMUSCULAR | Status: AC
  Filled 2015-11-08: qty 2

## 2015-11-08 MED ORDER — SODIUM CHLORIDE 0.9 % IV SOLN: Freq: Once | INTRAVENOUS | Status: DC

## 2015-11-08 MED ORDER — SODIUM CHLORIDE 0.9 % IV SOLN
INTRAVENOUS | Status: DC
  Administered 2015-11-08: 12:00:00 via INTRAVENOUS

## 2015-11-08 MED ORDER — PANTOPRAZOLE SODIUM 40 MG PO TBEC
40.0000 mg | DELAYED_RELEASE_TABLET | Freq: Two times a day (BID) | ORAL | Status: DC
  Administered 2015-11-08 – 2015-11-09 (×2): 40 mg via ORAL
  Filled 2015-11-08 (×3): qty 1

## 2015-11-08 MED ORDER — BUTAMBEN-TETRACAINE-BENZOCAINE 2-2-14 % EX AERO
1.0000 | INHALATION_SPRAY | Freq: Once | CUTANEOUS | Status: AC
  Administered 2015-11-08: 1 via TOPICAL

## 2015-11-08 MED ORDER — FUROSEMIDE 10 MG/ML IJ SOLN
20.0000 mg | Freq: Once | INTRAMUSCULAR | Status: DC
  Filled 2015-11-08: qty 2

## 2015-11-08 MED ORDER — DIPHENHYDRAMINE HCL 25 MG PO CAPS
25.0000 mg | ORAL_CAPSULE | Freq: Once | ORAL | Status: AC
  Administered 2015-11-08: 25 mg via ORAL
  Filled 2015-11-08: qty 1

## 2015-11-08 MED ORDER — PROPOFOL 10 MG/ML IV BOLUS
INTRAVENOUS | Status: AC
Start: 2015-11-08 — End: 2015-11-08
  Filled 2015-11-08: qty 20

## 2015-11-08 MED ORDER — MIDAZOLAM HCL 2 MG/2ML IJ SOLN
INTRAMUSCULAR | Status: AC
  Filled 2015-11-08: qty 2

## 2015-11-08 MED ORDER — MIDAZOLAM HCL 2 MG/2ML IJ SOLN
1.0000 mg | INTRAMUSCULAR | Status: DC | PRN
  Administered 2015-11-08: 2 mg via INTRAVENOUS

## 2015-11-08 MED ORDER — BUTAMBEN-TETRACAINE-BENZOCAINE 2-2-14 % EX AERO
INHALATION_SPRAY | CUTANEOUS | Status: AC
  Filled 2015-11-08: qty 20

## 2015-11-08 MED ORDER — PROPOFOL 500 MG/50ML IV EMUL
INTRAVENOUS | Status: DC | PRN
  Administered 2015-11-08: 75 ug/kg/min via INTRAVENOUS

## 2015-11-08 NOTE — Progress Notes (Signed)
  Subjective:  Patient has no complaints. He denies shortness of breath. He also denies abdominal pain nausea or vomiting. He had a bowel movement at 3:30 this morning and passed small amount of black stool.  Objective: Blood pressure (!) 110/56, pulse (!) 106, temperature 98.7 F (37.1 C), temperature source Oral, resp. rate 20, height 5\' 7"  (1.702 m), weight 239 lb 6.7 oz (108.6 kg), SpO2 100 %. Patient is alert and in no acute distress. He is sitting in her recliner. Conjunctiva is pale. Cardiac exam with regular rhythm normal S1 and S2. No murmur or gallop noted. Auscultation of lungs revealed diminished intensity of breath sounds bilaterally but no rales or rhonchi noted. Abdomen is protuberant. Bowel sounds normal. On palpation is soft and nontender. No LE edema or clubbing noted.  Labs/studies Results:   Recent Labs  11/06/15 0506 11/06/15 1924 11/07/15 0557 11/08/15 0616  WBC 6.4  --  3.8* 6.9  HGB 8.9* 8.3* 7.8* 6.2*  HCT 28.8* 26.0* 24.6* 19.4*  PLT 146*  --  128* 169    BMET   Recent Labs  11/06/15 0506 11/07/15 0557  NA 135 134*  K 4.3 3.7  CL 102 102  CO2 30 29  GLUCOSE 210* 202*  BUN 20 15  CREATININE 0.45* 0.41*  CALCIUM 7.6* 7.3*      Assessment:  #1. Upper GI bleed secondary to post bulbar duodenal ulcer. Status post repeat EGD 3 days ago. Patient's hemoglobin continues to drop. Therefore he must have intermittent stuttering bleed. Will need to be re-endoscoped but will need for him to be well sedated and not restless like he was at the time of last procedure. #2. Anemia secondary to upper GI bleed. Patient needs transfusion. #3. COPD. Patient appears to be breathing much better than last week.  Recommendations:  Transfuse 2 units of PRBCs after premedicated with acetaminophen 650 mg and Benadryl 25 mg by mouth. Esophagogastroduodenoscopy later today.

## 2015-11-08 NOTE — Progress Notes (Signed)
Blood consent signed. Premeds given. Verbal order from MD to hold NS fluids at 75. Will restart once 2 units are finished. RN will continue to monitor. Lesly Dukesachel J Everett, RN

## 2015-11-08 NOTE — Progress Notes (Signed)
CRITICAL VALUE ALERT  Critical value received:  Hgb-6.2  Date of notification: 11/08/15  Time of notification:  0715  Critical value read back:Yes.    Nurse who received alert:  M. Rubye Oaksickerson, RN  MD notified (1st page):  Rehman  Time of first page:  MD already aware  M

## 2015-11-08 NOTE — Anesthesia Postprocedure Evaluation (Signed)
Anesthesia Post Note  Patient: Bernette Redbirdndrew Baack Jr  Procedure(s) Performed: Procedure(s) (LRB): ESOPHAGOGASTRODUODENOSCOPY (EGD) WITH PROPOFOL (N/A)  Patient location during evaluation: PACU Anesthesia Type: MAC Level of consciousness: awake, oriented and patient cooperative Pain management: pain level controlled Vital Signs Assessment: post-procedure vital signs reviewed and stable Respiratory status: spontaneous breathing, nonlabored ventilation and respiratory function stable Cardiovascular status: blood pressure returned to baseline and stable Postop Assessment: no signs of nausea or vomiting Anesthetic complications: no    Last Vitals:  Vitals:   11/08/15 1425 11/08/15 1500  BP: (!) 145/49 134/71  Pulse:  100  Resp: (!) 33 (!) 23  Temp:  37.3 C    Last Pain:  Vitals:   11/08/15 1410  TempSrc: Oral  PainSc:                  Maxemiliano Riel J

## 2015-11-08 NOTE — Progress Notes (Signed)
Subjective: He complains of nausea. His hemoglobin level has dropped significantly. He is not really aware of any bleeding. During his procedure that was done earlier it was not clear if the coagulation of the ulcerated area was successful because he was unable to cooperate.  Objective: Vital signs in last 24 hours: Temp:  [98.6 F (37 C)-99.3 F (37.4 C)] 98.7 F (37.1 C) (08/07 0553) Pulse Rate:  [106-113] 106 (08/07 0553) Resp:  [20] 20 (08/07 0553) BP: (110-144)/(54-74) 110/56 (08/07 0553) SpO2:  [92 %-100 %] 100 % (08/07 0553) Weight change:  Last BM Date: 11/06/15  Intake/Output from previous day: 08/06 0701 - 08/07 0700 In: 420 [P.O.:420] Out: 350 [Urine:350]  PHYSICAL EXAM General appearance: alert, cooperative and mild distress Resp: clear to auscultation bilaterally Cardio: regular rate and rhythm, S1, S2 normal, no murmur, click, rub or gallop GI: Mild epigastric tenderness 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  Lab Results:  Results for orders placed or performed during the hospital encounter of 11/04/15 (from the past 48 hour(s))  Glucose, capillary     Status: Abnormal   Collection Time: 11/06/15 11:42 AM  Result Value Ref Range   Glucose-Capillary 205 (H) 65 - 99 mg/dL  Glucose, capillary     Status: Abnormal   Collection Time: 11/06/15  4:41 PM  Result Value Ref Range   Glucose-Capillary 210 (H) 65 - 99 mg/dL  Hemoglobin and hematocrit, blood     Status: Abnormal   Collection Time: 11/06/15  7:24 PM  Result Value Ref Range   Hemoglobin 8.3 (Davies) 13.0 - 17.0 g/dL   HCT 26.0 (Davies) 39.0 - 52.0 %  Glucose, capillary     Status: Abnormal   Collection Time: 11/06/15  9:15 PM  Result Value Ref Range   Glucose-Capillary 203 (H) 65 - 99 mg/dL  CBC with Differential/Platelet     Status: Abnormal   Collection Time: 11/07/15  5:57 AM  Result Value Ref Range   WBC 3.8 (Davies) 4.0 - 10.5 K/uL   RBC 2.75 (Davies) 4.22 - 5.81 MIL/uL   Hemoglobin 7.8  (Davies) 13.0 - 17.0 g/dL   HCT 24.6 (Davies) 39.0 - 52.0 %   MCV 89.5 78.0 - 100.0 fL   MCH 28.4 26.0 - 34.0 pg   MCHC 31.7 30.0 - 36.0 g/dL   RDW 16.7 (H) 11.5 - 15.5 %   Platelets 128 (Davies) 150 - 400 K/uL   Neutrophils Relative % 47 %   Neutro Abs 1.8 1.7 - 7.7 K/uL   Lymphocytes Relative 39 %   Lymphs Abs 1.5 0.7 - 4.0 K/uL   Monocytes Relative 10 %   Monocytes Absolute 0.4 0.1 - 1.0 K/uL   Eosinophils Relative 3 %   Eosinophils Absolute 0.1 0.0 - 0.7 K/uL   Basophils Relative 1 %   Basophils Absolute 0.0 0.0 - 0.1 K/uL  Basic metabolic panel     Status: Abnormal   Collection Time: 11/07/15  5:57 AM  Result Value Ref Range   Sodium 134 (Davies) 135 - 145 mmol/Davies   Potassium 3.7 3.5 - 5.1 mmol/Davies   Chloride 102 101 - 111 mmol/Davies   CO2 29 22 - 32 mmol/Davies   Glucose, Bld 202 (H) 65 - 99 mg/dL   BUN 15 6 - 20 mg/dL   Creatinine, Ser 0.41 (Davies) 0.61 - 1.24 mg/dL   Calcium 7.3 (Davies) 8.9 - 10.3 mg/dL   GFR calc non Af Amer >60 >60 mL/min   GFR calc Af  Amer >60 >60 mL/min    Comment: (NOTE) The eGFR has been calculated using the CKD EPI equation. This calculation has not been validated in all clinical situations. eGFR's persistently <60 mL/min signify possible Chronic Kidney Disease.    Anion gap 3 (Davies) 5 - 15  Glucose, capillary     Status: Abnormal   Collection Time: 11/07/15  7:25 AM  Result Value Ref Range   Glucose-Capillary 242 (H) 65 - 99 mg/dL  Glucose, capillary     Status: Abnormal   Collection Time: 11/07/15 11:41 AM  Result Value Ref Range   Glucose-Capillary 248 (H) 65 - 99 mg/dL  MRSA PCR Screening     Status: None   Collection Time: 11/07/15 12:32 PM  Result Value Ref Range   MRSA by PCR NEGATIVE NEGATIVE    Comment:        The GeneXpert MRSA Assay (FDA approved for NASAL specimens only), is one component of a comprehensive MRSA colonization surveillance program. It is not intended to diagnose MRSA infection nor to guide or monitor treatment for MRSA infections.   Glucose,  capillary     Status: Abnormal   Collection Time: 11/07/15  4:17 PM  Result Value Ref Range   Glucose-Capillary 260 (H) 65 - 99 mg/dL  Glucose, capillary     Status: Abnormal   Collection Time: 11/07/15  8:51 PM  Result Value Ref Range   Glucose-Capillary 248 (H) 65 - 99 mg/dL   Comment 1 Notify RN    Comment 2 Document in Chart   CBC with Differential/Platelet     Status: Abnormal   Collection Time: 11/08/15  6:16 AM  Result Value Ref Range   WBC 6.9 4.0 - 10.5 K/uL   RBC 2.17 (Davies) 4.22 - 5.81 MIL/uL   Hemoglobin 6.2 (LL) 13.0 - 17.0 g/dL    Comment: RESULT REPEATED AND VERIFIED CRITICAL RESULT CALLED TO, READ BACK BY AND VERIFIED WITH: DICKERSON,M AT 0715 BY HUFFINES,S ON 11/08/15.    HCT 19.4 (Davies) 39.0 - 52.0 %   MCV 89.4 78.0 - 100.0 fL   MCH 28.6 26.0 - 34.0 pg   MCHC 32.0 30.0 - 36.0 g/dL   RDW 17.4 (H) 11.5 - 15.5 %   Platelets 169 150 - 400 K/uL   Neutrophils Relative % 59 %   Neutro Abs 4.1 1.7 - 7.7 K/uL   Lymphocytes Relative 29 %   Lymphs Abs 2.0 0.7 - 4.0 K/uL   Monocytes Relative 11 %   Monocytes Absolute 0.8 0.1 - 1.0 K/uL   Eosinophils Relative 0 %   Eosinophils Absolute 0.0 0.0 - 0.7 K/uL   Basophils Relative 0 %   Basophils Absolute 0.0 0.0 - 0.1 K/uL  Glucose, capillary     Status: Abnormal   Collection Time: 11/08/15  8:34 AM  Result Value Ref Range   Glucose-Capillary 205 (H) 65 - 99 mg/dL   Comment 1 Notify RN     ABGS No results for input(s): PHART, PO2ART, TCO2, HCO3 in the last 72 hours.  Invalid input(s): PCO2 CULTURES Recent Results (from the past 240 hour(s))  MRSA PCR Screening     Status: None   Collection Time: 11/07/15 12:32 PM  Result Value Ref Range Status   MRSA by PCR NEGATIVE NEGATIVE Final    Comment:        The GeneXpert MRSA Assay (FDA approved for NASAL specimens only), is one component of a comprehensive MRSA colonization surveillance program. It is not intended to  diagnose MRSA infection nor to guide or monitor  treatment for MRSA infections.    Studies/Results: No results found.  Medications:  Prior to Admission:  Prescriptions Prior to Admission  Medication Sig Dispense Refill Last Dose  . albuterol (PROVENTIL HFA;VENTOLIN HFA) 108 (90 BASE) MCG/ACT inhaler Inhale 2 puffs into the lungs every 6 (six) hours as needed. Shortness of Breath   unknown at Unknown time  . canagliflozin (INVOKANA) 100 MG TABS tablet Take 3 tablets (300 mg total) by mouth daily before breakfast. 90 tablet 1 11/03/2015 at Unknown time  . colchicine 0.6 MG tablet Take 0.6 mg by mouth daily as needed (gout).    unknown  . furosemide (LASIX) 40 MG tablet Take 40 mg by mouth daily as needed for fluid.    Past Week at Unknown time  . ipratropium-albuterol (DUONEB) 0.5-2.5 (3) MG/3ML SOLN Take 3 mLs by nebulization 4 (four) times daily as needed (shortness of breath.).   unknown  . lisinopril-hydrochlorothiazide (PRINZIDE,ZESTORETIC) 10-12.5 MG tablet TAKE ONE TABLET BY MOUTH ONCE DAILY. 90 tablet 0 11/03/2015 at Unknown time  . meclizine (ANTIVERT) 25 MG tablet Take 25 mg by mouth 3 (three) times daily as needed for dizziness.   unknown  . metFORMIN (GLUCOPHAGE) 1000 MG tablet Take 1 tablet (1,000 mg total) by mouth 2 (two) times daily with a meal. 180 tablet 3 11/03/2015 at Unknown time  . Multiple Vitamin (MULTIVITAMIN WITH MINERALS) TABS tablet Take 1 tablet by mouth daily.   11/03/2015 at Unknown time  . mupirocin ointment (BACTROBAN) 2 % Place 1 application into the nose 2 (two) times daily.   11/03/2015 at Unknown time  . Omega-3 Fatty Acids (FISH OIL PO) Take 1 capsule by mouth daily.   11/03/2015 at Unknown time  . tamsulosin (FLOMAX) 0.4 MG CAPS capsule Take 0.4 mg by mouth.   11/03/2015 at Unknown time  . Blood Glucose Monitoring Suppl (ACCU-CHEK AVIVA) device Use as instructed 1 each 0 Taking  . glucose blood (ACCU-CHEK AVIVA) test strip Use to test glucose 4 times a day. 150 each 3 Taking   Scheduled: . sodium chloride    Intravenous Once  . acetaminophen  650 mg Oral Once  . diphenhydrAMINE  25 mg Oral Once  . furosemide  20 mg Intravenous Once  . insulin aspart  0-15 Units Subcutaneous TID WC  . iron polysaccharides  150 mg Oral Daily  . sucralfate  1 g Oral TID WC & HS  . tamsulosin  0.4 mg Oral QPC supper   Continuous: . sodium chloride 75 mL/hr at 11/07/15 2340   OXB:[id number], ondansetron **OR** ondansetron (ZOFRAN) IV  Assesment:He was admitted with rectal bleeding thought to be from peptic ulcer disease. He underwent a procedure with questionable effectiveness as he wasn't able to cooperate. He is probably still bleeding from the same area. Principal Problem:   Rectal bleeding Active Problems:   Type II diabetes mellitus, uncontrolled ([place])   Essential hypertension, benign   Morbid obesity due to excess calories (HCC)   Lactic acidosis   Hypovolemic shock (HCC)   Acute blood loss anemia   Tear of skin of buttock    Plan: He is going to have blood transfusion repeat procedure with either general anesthesia or deep propofol sedation.    LOS: 4 days   Dwayne Davies 11/08/2015, 8:48 AM

## 2015-11-08 NOTE — Progress Notes (Signed)
Inpatient Diabetes Program Recommendations  AACE/ADA: New Consensus Statement on Inpatient Glycemic Control (2015)  Target Ranges:  Prepandial:   less than 140 mg/dL      Peak postprandial:   less than 180 mg/dL (1-2 hours)      Critically ill patients:  140 - 180 mg/dL   Results for Munter JR, Jaidev (MRN [id number]) as of 11/08/2015 09:49  Ref. Range 11/07/2015 07:25 11/07/2015 11:41 11/07/2015 16:17 11/07/2015 20:51 8/7/20Bernette Redbird17 08:34  Glucose-Capillary Latest Ref Range: 65 - 99 mg/dL 409242 (H) 811248 (H) 914260 (H) 248 (H) 205 (H)   Review of Glycemic Control  Diabetes history: DM2 Outpatient Diabetes medications: Invokana 300 mg QAM, Metformin 1000 mg BID Current orders for Inpatient glycemic control: Novolog 0-15 units TID with meals  Inpatient Diabetes Program Recommendations: Insulin - Basal: Please consider ordering low dose basal insulin. Recommend ordering Levemir 10 units Q24H. Correction (SSI): Please order Novolog bedtime correction scale. If patient continues to have poor PO intake, may want to consider changing frequency to Q4H.  Thanks, Orlando PennerMarie Math Brazie, RN, MSN, CDE Diabetes Coordinator Inpatient Diabetes Program [phone] (Team Pager from 8am to 5pm) [phone] ([facility] office) [phone] [facility] office) [phone] [facility] office)'

## 2015-11-08 NOTE — Op Note (Signed)
[facility] Patient Name: Dwayne Davies Procedure Date: 11/08/2015 2:28 PM MRN: [id number] Date of Birth: 04/14/1955 Attending MD: Lionel DecemberNajeeb Tonika Eden , MD CSN: [id number] Age: [age] Admit Type: Inpatient Procedure:                Upper GI endoscopy Indications:              Acute post hemorrhagic anemia, Recent                            gastrointestinal bleeding Providers:                Lionel DecemberNajeeb Virgie Chery, MD, Nena PolioLisa Moore, RN, Loma MessingLurae B. Patsy LagerAlbert                            RN, RN, Lollie Marrowaylor B. Val EagleLemons, Technician Referring MD:             Oneal DeputyEdward L. Juanetta GoslingHawkins, MD Medicines:                Propofol per Anesthesia Complications:            No immediate complications. Estimated Blood Loss:     Estimated blood loss: none. Procedure:                Pre-Anesthesia Assessment:                           - Prior to the procedure, a History and Physical                            was performed, and patient medications and                            allergies were reviewed. The patient's tolerance of                            previous anesthesia was also reviewed. The risks                            and benefits of the procedure and the sedation                            options and risks were discussed with the patient.                            All questions were answered, and informed consent                            was obtained. Prior Anticoagulants: The patient                            last took naproxen 5 days prior to the procedure.                            ASA Grade Assessment: III - A patient with severe  systemic disease. After reviewing the risks and                            benefits, the patient was deemed in satisfactory                            condition to undergo the procedure.                           After obtaining informed consent, the endoscope was                            passed under direct vision. Throughout the   procedure, the patient's blood pressure, pulse, and                            oxygen saturations were monitored continuously. The                            [id number] ([id number]) scope was introduced through the                            mouth, and advanced to the second part of duodenum.                            The upper GI endoscopy was accomplished without                            difficulty. The patient tolerated the procedure                            well. Scope In: 2:40:44 PM Scope Out: 2:45:45 PM Total Procedure Duration: 0 hours 5 minutes 1 second  Findings:      The examined esophagus was normal.      The Z-line was regular and was found 38 cm from the incisors.      A 2 cm hiatal hernia was present.      Mild portal hypertensive gastropathy was found in the gastric body.      Scattered inflammation characterized by erosions was found in the       gastric antrum.      The exam of the stomach was otherwise normal.      Patchy mild inflammation characterized by congestion (edema), erythema       and granularity was found in the duodenal bulb.      An acquired benign-appearing, intrinsic mild stenosis was found in the       second portion of the duodenum.      One non-bleeding cratered duodenal ulcer with pigmented material was       found in the second portion of the duodenum. The lesion was ten mm by       fifteen mm in largest dimension.      Single clip noted at distal edge of this ulcer. Impression:               - Normal esophagus.                           -  Z-line regular, 38 cm from the incisors.                           - 2 cm hiatal hernia.                           - Portal hypertensive gastropathy.                           - Gastritis.                           - Duodenitis.                           - Acquired duodenal stenosis.                           - One non-bleeding duodenal ulcer with pigmented                            material.                            - No specimens collected. Moderate Sedation:      Per Anesthesia Care Recommendation:           - Return patient to hospital ward for ongoing care.                           - Resume previous diet.                           - No aspirin, ibuprofen, naproxen, or other                            non-steroidal anti-inflammatory drugs.                           - Repeat upper endoscopy in 10 months to check                            healing. Procedure Code(s):        --- Professional ---                           [phone], Esophagogastroduodenoscopy, flexible,                            transoral; diagnostic, including collection of                            specimen(s) by brushing or washing, when performed                            (separate procedure) Diagnosis Code(s):        --- Professional ---  K44.9, Diaphragmatic hernia without obstruction or                            gangrene                           K76.6, Portal hypertension                           K31.89, Other diseases of stomach and duodenum                           K29.70, Gastritis, unspecified, without bleeding                           K29.80, Duodenitis without bleeding                           K31.5, Obstruction of duodenum                           K26.9, Duodenal ulcer, unspecified as acute or                            chronic, without hemorrhage or perforation                           D62, Acute posthemorrhagic anemia                           K92.2, Gastrointestinal hemorrhage, unspecified CPT copyright 2016 American Medical Association. All rights reserved. The codes documented in this report are preliminary and upon coder review may  be revised to meet current compliance requirements. Lionel December, MD Lionel December, MD 11/08/2015 3:02:33 PM This report has been signed electronically. Number of Addenda: 0

## 2015-11-08 NOTE — Anesthesia Preprocedure Evaluation (Addendum)
Anesthesia Evaluation  Patient identified by MRN, date of birth, ID band Patient awake    Reviewed: Allergy & Precautions, H&P , NPO status , Patient's Chart, lab work & pertinent test results  Airway Mallampati: III  TM Distance: >3 FB     Dental  (+) Poor Dentition, Chipped   Pulmonary shortness of breath, with exertion and at rest, COPD,  COPD inhaler, former smoker,    breath sounds clear to auscultation- rhonchi (clkear with cough)       Cardiovascular hypertension, Pt. on medications  Rhythm:Regular Rate:Normal     Neuro/Psych Anxiety    GI/Hepatic GERD  ,  Endo/Other  diabetes, Poorly Controlled, Type obesity  Renal/GU      Musculoskeletal   Abdominal (+) + obese,   Peds  Hematology   Anesthesia Other Findings   Reproductive/Obstetrics                             Anesthesia Physical Anesthesia Plan  ASA: IV  Anesthesia Plan: MAC   Post-op Pain Management:    Induction: Intravenous  Airway Management Planned: Simple Face Mask  Additional Equipment:   Intra-op Plan:   Post-operative Plan:   Informed Consent: I have reviewed the patients History and Physical, chart, labs and discussed the procedure including the risks, benefits and alternatives for the proposed anesthesia with the patient or authorized representative who has indicated his/her understanding and acceptance.     Plan Discussed with:   Anesthesia Plan Comments: (Transfusing 2 PRBC's in preop.)       Anesthesia Quick Evaluation

## 2015-11-08 NOTE — Transfer of Care (Signed)
Immediate Anesthesia Transfer of Care Note  Patient: Bernette Redbirdndrew Schou Jr  Procedure(s) Performed: Procedure(s): ESOPHAGOGASTRODUODENOSCOPY (EGD) WITH PROPOFOL (N/A)  Patient Location: PACU  Anesthesia Type:MAC  Level of Consciousness: awake and patient cooperative  Airway & Oxygen Therapy: Patient Spontanous Breathing and Patient connected to face mask oxygen  Post-op Assessment: Report given to RN, Post -op Vital signs reviewed and stable and Patient moving all extremities  Post vital signs: Reviewed and stable  Last Vitals:  Vitals:   11/08/15 1420 11/08/15 1425  BP:  (!) 145/49  Pulse:    Resp: (!) 24 (!) 33  Temp:      Last Pain:  Vitals:   11/08/15 1410  TempSrc: Oral  PainSc:       Patients Stated Pain Goal: 6 (11/05/15 1005)  Complications: No apparent anesthesia complications

## 2015-11-08 NOTE — Op Note (Signed)
Brief EGD findings.  Small sliding hiatal hernia. Mild portal gastropathy. Erosive antral gastritis Bulbar duodenitis with post bulbar noncritical due to stricture. Duodenal ulcer located distal to the stricture with black eschar but no active bleeding or fresh clot. One clip noted at the edge of this ulcer. No therapy rendered.

## 2015-11-08 NOTE — Care Management Important Message (Signed)
Important Message  Patient Details  Name: Dwayne Davies MRN: [id number] Date of Birth: [birth year]   Medicare Important Message Given:  Yes    Pat Sires, Chrystine OilerSharley Diane, RN 11/08/2015, 12:05 PM

## 2015-11-09 DIAGNOSIS — K269 Duodenal ulcer, unspecified as acute or chronic, without hemorrhage or perforation: Secondary | ICD-10-CM | POA: Diagnosis present

## 2015-11-09 DIAGNOSIS — K2270 Barrett's esophagus without dysplasia: Secondary | ICD-10-CM | POA: Diagnosis present

## 2015-11-09 DIAGNOSIS — T39395A Adverse effect of other nonsteroidal anti-inflammatory drugs [NSAID], initial encounter: Secondary | ICD-10-CM

## 2015-11-09 LAB — TYPE AND SCREEN
ABO/RH(D): A POS
ANTIBODY SCREEN: NEGATIVE
UNIT DIVISION: 0
UNIT DIVISION: 0

## 2015-11-09 LAB — CBC
HCT: 24.2 % — ABNORMAL LOW (ref 39.0–52.0)
Hemoglobin: 8 g/dL — ABNORMAL LOW (ref 13.0–17.0)
MCH: 29.4 pg (ref 26.0–34.0)
MCHC: 33.1 g/dL (ref 30.0–36.0)
MCV: 89 fL (ref 78.0–100.0)
PLATELETS: 165 10*3/uL (ref 150–400)
RBC: 2.72 MIL/uL — AB (ref 4.22–5.81)
RDW: 17 % — AB (ref 11.5–15.5)
WBC: 6.1 10*3/uL (ref 4.0–10.5)

## 2015-11-09 LAB — GLUCOSE, CAPILLARY: GLUCOSE-CAPILLARY: 196 mg/dL — AB (ref 65–99)

## 2015-11-09 MED ORDER — SUCRALFATE 1 GM/10ML PO SUSP: 1.0000 g | Freq: Three times a day (TID) | ORAL | 0 refills | Status: DC

## 2015-11-09 MED ORDER — POLYSACCHARIDE IRON COMPLEX 150 MG PO CAPS: 150.0000 mg | ORAL_CAPSULE | Freq: Every day | ORAL | 2 refills | Status: DC

## 2015-11-09 MED ORDER — PANTOPRAZOLE SODIUM 40 MG PO TBEC: 40.0000 mg | DELAYED_RELEASE_TABLET | Freq: Two times a day (BID) | ORAL | 12 refills | Status: DC

## 2015-11-09 NOTE — Progress Notes (Signed)
He feels better and wants to go home. He tolerated the endoscopy well last night. I discussed the situation with GI consultant and it is felt that it safe for Dwayne Davies to go home today. Please see discharge summary for details

## 2015-11-09 NOTE — Progress Notes (Signed)
Late entry for 11/08/15. Pt had a order for 2 units to be transfused. RN started transfusing first unit of PRBC with completion of VS before unit started and first 15 min VS after unit is started. RN received call from ENDO regarding Pt being brought down to have EGD performed. RN stated to ENDO RN about unit still being transfused.  ENDO RN stated that MD wanted patient to have EGD while first unit was still being tranfused. Pt was brought down to ENDO with unit still transfusing. Blood slip still on Unit. When RN received report from ENDO RN, I asked if second unit was completed along with VS and documentation. RN could not tell me if it was completed or not. RN went to patient chart and saw both blood slips but no VS or completion documentation in pt's chart. RN was unable to write completion time on first unit due to pt being in ENDO and unaware of what time.  RN placed check off list in blood audit book and was unable to finish the check off due to ENDO RN not stated time or placing documentation in chart. Lesly Dukesachel J Everett, RN

## 2015-11-09 NOTE — Progress Notes (Signed)
  Subjective:  Patient has no complaints. He denies nausea vomiting or abdominal pain. He asked small amount of formed black stool last night.  Objective: Blood pressure (!) 147/68, pulse 95, temperature 98.9 F (37.2 C), temperature source Oral, resp. rate 18, height 5\' 8"  (1.727 m), weight 253 lb (114.8 kg), SpO2 100 %. Carollee HerterShannon is alert and in no acute distress. Abdomen is protuberant but soft and nontender without organomegaly or masses. No LE edema or clubbing noted.  Labs/studies Results:   Recent Labs  11/07/15 0557 11/08/15 0616 11/08/15 1520 11/09/15 0007  WBC 3.8* 6.9  --  6.1  HGB 7.8* 6.2* 8.4* 8.0*  HCT 24.6* 19.4* 25.7* 24.2*  PLT 128* 169  --  165    BMET   Recent Labs  11/07/15 0557  NA 134*  K 3.7  CL 102  CO2 29  GLUCOSE 202*  BUN 15  CREATININE 0.41*  CALCIUM 7.3*      Assessment:  #1.Upper GI bleed secondary to post bulbar duodenal ulcer. She'll EGD with endoscopic therapy was 4 days ago. EGD yesterday revealed no evidence of recurrent bleed. H. pylori serology is negative. Peptic ulcer disease felt to be secondary to chronic NSAID use although location somewhat atypical. #2. Anemia secondary to GI blood loss. He has received 2 units of PRBCs. Hemoglobin is borderline and 8 g but he is tolerating it well.  Recommendations:  Discussed with Dr. Juanetta GoslingHawkins. He has to be hemodynamically stable for discharge. Should have H&H checked later this week. Once again patient advised not to take OTC NSAIDs presence of his brother. Continue sucralfate for 1 month. Pantoprazole at 40 mg by mouth twice a day until ulcer documented to have healed completely. Will arrange for EGD and colonoscopy in 10 weeks.

## 2015-11-09 NOTE — Discharge Summary (Signed)
Physician Discharge Summary  Patient ID: Dwayne Davies MRN: [id number] DOB/AGE: Aug 05, 1955 60 y.o. 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 Shortness of Breath   canagliflozin 100 MG Tabs tablet Commonly known as:  INVOKANA Take 3 tablets (300 mg total) by mouth daily before breakfast.   colchicine 0.6 MG tablet Take 0.6 mg by mouth daily as needed (gout).   FISH OIL PO Take 1 capsule by mouth daily.   furosemide 40 MG tablet Commonly known as:  LASIX Take 40 mg by mouth daily as needed for fluid.   glucose blood test strip Commonly known as:  ACCU-CHEK AVIVA Use to test glucose 4 times a day.   ipratropium-albuterol 0.5-2.5 (3) MG/3ML Soln Commonly known as:  DUONEB Take 3 mLs by nebulization 4 (four) times daily as needed (shortness of breath.).   iron polysaccharides 150 MG capsule Commonly known as:  NIFEREX Take 1 capsule (150 mg total) by mouth daily.   lisinopril-hydrochlorothiazide 10-12.5 MG tablet Commonly known as:  PRINZIDE,ZESTORETIC TAKE ONE TABLET BY MOUTH ONCE DAILY.   meclizine 25 MG tablet Commonly known as:  ANTIVERT Take 25 mg by mouth 3 (three) times daily as needed for dizziness.   metFORMIN 1000 MG tablet Commonly known as:  GLUCOPHAGE Take 1 tablet  (1,000 mg total) by mouth 2 (two) times daily with a meal.   multivitamin with minerals Tabs tablet Take 1 tablet by mouth daily.   mupirocin ointment 2 % Commonly known as:  BACTROBAN Place 1 application into the nose 2 (two) times daily.   pantoprazole 40 MG tablet Commonly known as:  PROTONIX Take 1 tablet (40 mg total) by mouth 2 (two) times daily before a meal.   sucralfate 1 GM/10ML suspension Commonly known as:  CARAFATE Take 10 mLs (1 g total) by mouth 4 (four) times daily -  with meals and at bedtime.   tamsulosin 0.4 MG Caps capsule Commonly known as:  FLOMAX Take 0.4 mg by mouth.       Discharged Condition:Improved    Consults: GI  Significant Diagnostic Studies: Ct Abdomen Pelvis W Contrast  Result Date: 11/04/2015 CLINICAL DATA:  Lower abdominal crampy pain for 2 days, hematochezia at tonight. Poor historian. History of diabetes, hypertension. EXAM: CT ABDOMEN AND PELVIS WITH CONTRAST TECHNIQUE: Multidetector CT imaging of the abdomen and pelvis was performed using the standard protocol following bolus administration of intravenous contrast. CONTRAST:  ISOVUE-300 IOPAMIDOL (ISOVUE-300) INJECTION 61% COMPARISON:  None. FINDINGS: Altered anatomy as patient was scanned on LEFT side. LUNG BASES: Lingular and bibasilar atelectasis. Heart size is normal. No pericardial effusions. SOLID ORGANS: The liver, spleen, pancreas and adrenal glands are unremarkable. Dense layering gallbladder sludge versus tiny stones, no CT findings of acute cholecystitis. GASTROINTESTINAL TRACT: Focally thickened, patulous duodenum with  focal intramural gas and mild paraduodenal fat stranding. The stomach, large bowel are normal in course and caliber without inflammatory changes. Moderate descending and proximal sigmoid diverticulosis. Status post appendectomy. KIDNEYS/ URINARY TRACT: Kidneys are orthotopic, demonstrating symmetric enhancement. 3 mm RIGHT upper pole, 2 mm RIGHT upper pole,  punctate RIGHT upper pole, 2 mm RIGHT interpolar and 5 mm RIGHT lower pole nephrolithiasis. 2 mm LEFT lower pole, punctate LEFT lower pole nephrolithiasis. No hydronephrosis or solid renal masses. The unopacified ureters are normal in course and caliber. Delayed imaging through the kidneys demonstrates symmetric prompt contrast excretion within the proximal urinary collecting system. Urinary bladder is well distended and unremarkable. PERITONEUM/RETROPERITONEUM: Aortoiliac vessels are normal in course and caliber, mild calcific atherosclerosis. No lymphadenopathy by CT size criteria. Mild prostatomegaly. RIGHT pelvic surgical clips. No intraperitoneal free fluid nor free air. SOFT TISSUE/OSSEOUS STRUCTURES: Non-suspicious. Small fat containing LEFT inguinal hernia. Ankylosis of the sacroiliac joints. Mild lumbar levoscoliosis. Mild degenerative change of the thoracolumbar spine. IMPRESSION: Findings of acute duodenum ulcer, no definite perforation and no abscess. Recommend endoscopy. Colonic diverticulosis.  Cholelithiasis. Nonobstructing bilateral nephrolithiasis. These results will be called to the ordering clinician or representative by the Radiologist Assistant, and communication documented in the PACS or zVision Dashboard. Electronically Signed   By: Awilda Metroourtnay  Bloomer M.D.   On: 11/04/2015 05:35    Lab Results: Basic Metabolic Panel:  Recent Labs  13/11/6506/06/17 0557  NA 134*  K 3.7  CL 102  CO2 29  GLUCOSE 202*  BUN 15  CREATININE 0.41*  CALCIUM 7.3*   Liver Function Tests: No results for input(s): AST, ALT, ALKPHOS, BILITOT, PROT, ALBUMIN in the last 72 hours.   CBC:  Recent Labs  11/07/15 0557 11/08/15 0616 11/08/15 1520 11/09/15 0007  WBC 3.8* 6.9  --  6.1  NEUTROABS 1.8 4.1  --   --   HGB 7.8* 6.2* 8.4* 8.0*  HCT 24.6* 19.4* 25.7* 24.2*  MCV 89.5 89.4  --  89.0  PLT 128* 169  --  165    Recent Results (from the past 240 hour(s))  MRSA PCR Screening     Status: None    Collection Time: 11/07/15 12:32 PM  Result Value Ref Range Status   MRSA by PCR NEGATIVE NEGATIVE Final    Comment:        The GeneXpert MRSA Assay (FDA approved for NASAL specimens only), is one component of a comprehensive MRSA colonization surveillance program. It is not intended to diagnose MRSA infection nor to guide or monitor treatment for MRSA infections.      Hospital Course: This is a 60 year old came to the emergency department because of GI bleeding. He was tachycardic and had some hypovolemic shock. His hemoglobin level stayed stable for some time. He underwent EGD which was not technically successful in trying to coagulate an ulcer. He was treated with Carafate and pantoprazole. He continued to have some bleeding and eventually required blood transfusion. His blood pressure improved. He underwent EGD again and this time showed healing ulcer with no stigmata of recent bleeding. He feels better has been able to ambulate in the hall has some mild weakness. It is felt that his bleeding was related to the use of nonsteroidal anti-inflammatory drugs  Discharge Exam: Blood pressure (!) 147/68, pulse 95, temperature 98.9 F (37.2 C), temperature source Oral, resp. rate 18, height 5\' 8"  (1.727 m), weight 114.8 kg (253 lb), SpO2 100 %. He is awake and alert. He looks comfortable. His chest is clear.  His abdomen is soft  Disposition: Home he will be set up for repeat CBC in about 2 days  Discharge Instructions    Discharge patient    Complete by:  As directed   Discharge patient    Complete by:  As directed        Signed: Kaytelyn Glore L   11/09/2015, 8:59 AM

## 2015-11-10 ENCOUNTER — Encounter (HOSPITAL_COMMUNITY): Payer: Self-pay | Admitting: Internal Medicine

## 2015-11-11 ENCOUNTER — Encounter (HOSPITAL_COMMUNITY): Payer: Self-pay | Admitting: Internal Medicine

## 2016-09-14 NOTE — Patient Instructions (Signed)
Your procedure is scheduled on: 09/25/2016  Report to [facility] at 1140   AM.  Call this number if you have problems the morning of surgery: [phone]   Do not eat food or drink liquids :After Midnight.      Take these medicines the morning of surgery with A SIP OF WATER: Colchicine, lisinopril, antivert, protonix, flomax. Use your inhaler and your nebulizer before you come. DO NOT take any medications for diabetes the morning of your procedure.   Do not wear jewelry, make-up or nail polish.  Do not wear lotions, powders, or perfumes. You may wear deodorant.  Do not shave 48 hours prior to surgery.  Do not bring valuables to the hospital.  Contacts, dentures or bridgework may not be worn into surgery.  Leave suitcase in the car. After surgery it may be brought to your room.  For patients admitted to the hospital, checkout time is 11:00 AM the day of discharge.   Patients discharged the day of surgery will not be allowed to drive home.  :     Please read over the following fact sheets that you were given: Coughing and Deep Breathing, Surgical Site Infection Prevention, Anesthesia Post-op Instructions and Care and Recovery After Surgery    Cataract A cataract is a clouding of the lens of the eye. When a lens becomes cloudy, vision is reduced based on the degree and nature of the clouding. Many cataracts reduce vision to some degree. Some cataracts make people more near-sighted as they develop. Other cataracts increase glare. Cataracts that are ignored and become worse can sometimes look white. The white color can be seen through the pupil. CAUSES   Aging. However, cataracts may occur at any age, even in newborns.   Certain drugs.   Trauma to the eye.   Certain diseases such as diabetes.   Specific eye diseases such as chronic inflammation inside the eye or a sudden attack of a rare form of glaucoma.   Inherited or acquired medical problems.  SYMPTOMS   Gradual, progressive drop  in vision in the affected eye.   Severe, rapid visual loss. This most often happens when trauma is the cause.  DIAGNOSIS  To detect a cataract, an eye doctor examines the lens. Cataracts are best diagnosed with an exam of the eyes with the pupils enlarged (dilated) by drops.  TREATMENT  For an early cataract, vision may improve by using different eyeglasses or stronger lighting. If that does not help your vision, surgery is the only effective treatment. A cataract needs to be surgically removed when vision loss interferes with your everyday activities, such as driving, reading, or watching TV. A cataract may also have to be removed if it prevents examination or treatment of another eye problem. Surgery removes the cloudy lens and usually replaces it with a substitute lens (intraocular lens, IOL).  At a time when both you and your doctor agree, the cataract will be surgically removed. If you have cataracts in both eyes, only one is usually removed at a time. This allows the operated eye to heal and be out of danger from any possible problems after surgery (such as infection or poor wound healing). In rare cases, a cataract may be doing damage to your eye. In these cases, your caregiver may advise surgical removal right away. The vast majority of people who have cataract surgery have better vision afterward. HOME CARE INSTRUCTIONS  If you are not planning surgery, you may be asked to do  the following:  Use different eyeglasses.   Use stronger or brighter lighting.   Ask your eye doctor about reducing your medicine dose or changing medicines if it is thought that a medicine caused your cataract. Changing medicines does not make the cataract go away on its own.   Become familiar with your surroundings. Poor vision can lead to injury. Avoid bumping into things on the affected side. You are at a higher risk for tripping or falling.   Exercise extreme care when driving or operating machinery.   Wear  sunglasses if you are sensitive to bright light or experiencing problems with glare.  SEEK IMMEDIATE MEDICAL CARE IF:   You have a worsening or sudden vision loss.   You notice redness, swelling, or increasing pain in the eye.   You have a fever.  Document Released: 03/20/2005 Document Revised: 03/09/2011 Document Reviewed: 11/11/2010 [facility] Patient Information 2012 [employer] [place].PATIENT INSTRUCTIONS POST-ANESTHESIA  IMMEDIATELY FOLLOWING SURGERY:  Do not drive or operate machinery for the first twenty four hours after surgery.  Do not make any important decisions for twenty four hours after surgery or while taking narcotic pain medications or sedatives.  If you develop intractable nausea and vomiting or a severe headache please notify your doctor immediately.  FOLLOW-UP:  Please make an appointment with your surgeon as instructed. You do not need to follow up with anesthesia unless specifically instructed to do so.  WOUND CARE INSTRUCTIONS (if applicable):  Keep a dry clean dressing on the anesthesia/puncture wound site if there is drainage.  Once the wound has quit draining you may leave it open to air.  Generally you should leave the bandage intact for twenty four hours unless there is drainage.  If the epidural site drains for more than 36-48 hours please call the anesthesia department.  QUESTIONS?:  Please feel free to call your physician or the hospital operator if you have any questions, and they will be happy to assist you.

## 2016-09-19 ENCOUNTER — Encounter (HOSPITAL_COMMUNITY)
Admission: RE | Admit: 2016-09-19 | Discharge: 2016-09-19 | Disposition: A | Discharge status: Home / Self Care | Payer: Medicare Other | Source: Ambulatory Visit | Attending: Ophthalmology | Admitting: Ophthalmology

## 2016-09-19 ENCOUNTER — Encounter (HOSPITAL_COMMUNITY): Payer: Self-pay

## 2016-09-19 DIAGNOSIS — Z01818 Encounter for other preprocedural examination: Secondary | ICD-10-CM | POA: Diagnosis present

## 2016-09-19 HISTORY — DX: Hereditary motor and sensory neuropathy: G60.0

## 2016-09-19 HISTORY — DX: Personal history of urinary calculi: Z87.442

## 2016-09-19 LAB — BASIC METABOLIC PANEL
ANION GAP: 9 (ref 5–15)
BUN: 9 mg/dL (ref 6–20)
CALCIUM: 8.4 mg/dL — AB (ref 8.9–10.3)
CO2: 27 mmol/L (ref 22–32)
Chloride: 96 mmol/L — ABNORMAL LOW (ref 101–111)
Creatinine, Ser: 0.54 mg/dL — ABNORMAL LOW (ref 0.61–1.24)
GLUCOSE: 366 mg/dL — AB (ref 65–99)
Potassium: 3.6 mmol/L (ref 3.5–5.1)
SODIUM: 132 mmol/L — AB (ref 135–145)

## 2016-09-19 LAB — CBC WITH DIFFERENTIAL/PLATELET
BASOS ABS: 0 10*3/uL (ref 0.0–0.1)
BASOS PCT: 1 %
EOS ABS: 0.1 10*3/uL (ref 0.0–0.7)
EOS PCT: 2 %
HCT: 42.5 % (ref 39.0–52.0)
Hemoglobin: 13.6 g/dL (ref 13.0–17.0)
Lymphocytes Relative: 29 %
Lymphs Abs: 1.1 10*3/uL (ref 0.7–4.0)
MCH: 27 pg (ref 26.0–34.0)
MCHC: 32 g/dL (ref 30.0–36.0)
MCV: 84.5 fL (ref 78.0–100.0)
MONO ABS: 0.4 10*3/uL (ref 0.1–1.0)
Monocytes Relative: 11 %
Neutro Abs: 2.1 10*3/uL (ref 1.7–7.7)
Neutrophils Relative %: 57 %
PLATELETS: 105 10*3/uL — AB (ref 150–400)
RBC: 5.03 MIL/uL (ref 4.22–5.81)
RDW: 16 % — AB (ref 11.5–15.5)
WBC: 3.7 10*3/uL — AB (ref 4.0–10.5)

## 2016-09-25 ENCOUNTER — Encounter (HOSPITAL_COMMUNITY): Payer: Self-pay | Admitting: *Deleted

## 2016-09-25 ENCOUNTER — Ambulatory Visit (HOSPITAL_COMMUNITY)
Admission: RE | Admit: 2016-09-25 | Discharge: 2016-09-25 | Disposition: A | Discharge status: Home / Self Care | Payer: Medicare Other | Source: Ambulatory Visit | Attending: Ophthalmology | Admitting: Ophthalmology

## 2016-09-25 ENCOUNTER — Ambulatory Visit (HOSPITAL_COMMUNITY): Payer: Medicare Other | Admitting: Anesthesiology

## 2016-09-25 ENCOUNTER — Encounter (HOSPITAL_COMMUNITY)
Admission: RE | Disposition: A | Discharge status: Home / Self Care | Payer: Self-pay | Source: Ambulatory Visit | Attending: Ophthalmology

## 2016-09-25 DIAGNOSIS — J449 Chronic obstructive pulmonary disease, unspecified: Secondary | ICD-10-CM | POA: Diagnosis not present

## 2016-09-25 DIAGNOSIS — E119 Type 2 diabetes mellitus without complications: Secondary | ICD-10-CM | POA: Insufficient documentation

## 2016-09-25 DIAGNOSIS — Z7984 Long term (current) use of oral hypoglycemic drugs: Secondary | ICD-10-CM | POA: Diagnosis not present

## 2016-09-25 DIAGNOSIS — I10 Essential (primary) hypertension: Secondary | ICD-10-CM | POA: Diagnosis not present

## 2016-09-25 DIAGNOSIS — Z79899 Other long term (current) drug therapy: Secondary | ICD-10-CM | POA: Diagnosis not present

## 2016-09-25 DIAGNOSIS — H25812 Combined forms of age-related cataract, left eye: Secondary | ICD-10-CM | POA: Insufficient documentation

## 2016-09-25 HISTORY — PX: CATARACT EXTRACTION W/PHACO: SHX586

## 2016-09-25 LAB — GLUCOSE, CAPILLARY: GLUCOSE-CAPILLARY: 261 mg/dL — AB (ref 65–99)

## 2016-09-25 SURGERY — PHACOEMULSIFICATION, CATARACT, WITH IOL INSERTION
Anesthesia: Monitor Anesthesia Care | Site: Eye | Laterality: Left

## 2016-09-25 MED ORDER — MIDAZOLAM HCL 2 MG/2ML IJ SOLN
1.0000 mg | Freq: Once | INTRAMUSCULAR | Status: AC | PRN
  Administered 2016-09-25: 1 mg via INTRAVENOUS
  Filled 2016-09-25: qty 2

## 2016-09-25 MED ORDER — LIDOCAINE HCL (PF) 1 % IJ SOLN
INTRAOCULAR | Status: DC | PRN
  Administered 2016-09-25: .9 mL via OPHTHALMIC

## 2016-09-25 MED ORDER — ALBUTEROL SULFATE (2.5 MG/3ML) 0.083% IN NEBU
2.5000 mg | INHALATION_SOLUTION | Freq: Once | RESPIRATORY_TRACT | Status: AC
  Administered 2016-09-25: 2.5 mg via RESPIRATORY_TRACT
  Filled 2016-09-25: qty 3

## 2016-09-25 MED ORDER — POVIDONE-IODINE 5 % OP SOLN
OPHTHALMIC | Status: DC | PRN
  Administered 2016-09-25: 1 via OPHTHALMIC

## 2016-09-25 MED ORDER — MIDAZOLAM HCL 2 MG/2ML IJ SOLN
INTRAMUSCULAR | Status: AC
  Filled 2016-09-25: qty 2

## 2016-09-25 MED ORDER — TETRACAINE HCL 0.5 % OP SOLN
1.0000 [drp] | OPHTHALMIC | Status: AC
  Administered 2016-09-25 (×3): 1 [drp] via OPHTHALMIC

## 2016-09-25 MED ORDER — CYCLOPENTOLATE-PHENYLEPHRINE 0.2-1 % OP SOLN
1.0000 [drp] | OPHTHALMIC | Status: AC
  Administered 2016-09-25 (×3): 1 [drp] via OPHTHALMIC

## 2016-09-25 MED ORDER — PROVISC 10 MG/ML IO SOLN
INTRAOCULAR | Status: DC | PRN
  Administered 2016-09-25: 0.85 mL via INTRAOCULAR

## 2016-09-25 MED ORDER — LACTATED RINGERS IV SOLN
INTRAVENOUS | Status: DC
  Administered 2016-09-25: 13:00:00 via INTRAVENOUS

## 2016-09-25 MED ORDER — MIDAZOLAM HCL 5 MG/5ML IJ SOLN
INTRAMUSCULAR | Status: DC | PRN
  Administered 2016-09-25: 0.5 mg via INTRAVENOUS
  Administered 2016-09-25: 1 mg via INTRAVENOUS
  Administered 2016-09-25: 0.5 mg via INTRAVENOUS

## 2016-09-25 MED ORDER — PHENYLEPHRINE HCL 2.5 % OP SOLN
1.0000 [drp] | OPHTHALMIC | Status: AC
  Administered 2016-09-25 (×3): 1 [drp] via OPHTHALMIC

## 2016-09-25 MED ORDER — BSS IO SOLN
INTRAOCULAR | Status: DC | PRN
  Administered 2016-09-25: 500 mL

## 2016-09-25 MED ORDER — NEOMYCIN-POLYMYXIN-DEXAMETH 3.5-10000-0.1 OP SUSP
OPHTHALMIC | Status: DC | PRN
  Administered 2016-09-25: 2 [drp] via OPHTHALMIC

## 2016-09-25 MED ORDER — LIDOCAINE HCL 3.5 % OP GEL
1.0000 "application " | Freq: Once | OPHTHALMIC | Status: AC
  Administered 2016-09-25: 1 via OPHTHALMIC
  Filled 2016-09-25: qty 1

## 2016-09-25 MED ORDER — BSS IO SOLN
INTRAOCULAR | Status: DC | PRN
  Administered 2016-09-25: 15 mL

## 2016-09-25 SURGICAL SUPPLY — 10 items

## 2016-09-25 NOTE — Transfer of Care (Signed)
Immediate Anesthesia Transfer of Care Note  Patient: Dwayne Davies.  Procedure(s) Performed: Procedure(s) with comments: CATARACT EXTRACTION PHACO AND INTRAOCULAR LENS PLACEMENT (IOC) (Left) - CDE:  19.49  Patient Location: Short Stay  Anesthesia Type:MAC  Level of Consciousness: awake, alert , oriented and patient cooperative  Airway & Oxygen Therapy: Patient Spontanous Breathing  Post-op Assessment: Report given to RN and Post -op Vital signs reviewed and stable  Post vital signs: Reviewed and stable  Last Vitals:  Vitals:   09/25/16 1410 09/25/16 1438  BP:  (!) 141/79  Pulse:  99  Resp: 16 16  Temp:  36.5 C    Last Pain:  Vitals:   09/25/16 1438  TempSrc: Oral      Patients Stated Pain Goal: 4 (20/25/42 7062)  Complications: No apparent anesthesia complications

## 2016-09-25 NOTE — Anesthesia Preprocedure Evaluation (Signed)
Anesthesia Evaluation  Patient identified by MRN, date of birth, ID band Patient awake    Airway Mallampati: I  TM Distance: >3 FB Neck ROM: Full    Dental   Pulmonary shortness of breath and at rest, COPD,  COPD inhaler,     + decreased breath sounds      Cardiovascular hypertension,  Rhythm:Regular Rate:Normal     Neuro/Psych    GI/Hepatic PUD, GERD  Medicated,  Endo/Other  diabetes, Type 2  Renal/GU      Musculoskeletal   Abdominal   Peds  Hematology   Anesthesia Other Findings   Reproductive/Obstetrics                             Anesthesia Physical Anesthesia Plan  ASA: IV  Anesthesia Plan: MAC   Post-op Pain Management:    Induction: Intravenous  PONV Risk Score and Plan:   Airway Management Planned: Nasal Cannula  Additional Equipment:   Intra-op Plan:   Post-operative Plan:   Informed Consent: I have reviewed the patients History and Physical, chart, labs and discussed the procedure including the risks, benefits and alternatives for the proposed anesthesia with the patient or authorized representative who has indicated his/her understanding and acceptance.     Plan Discussed with: CRNA  Anesthesia Plan Comments:         Anesthesia Quick Evaluation

## 2016-09-25 NOTE — Anesthesia Postprocedure Evaluation (Signed)
Anesthesia Post Note  Patient: Mico Spark.  Procedure(s) Performed: Procedure(s) (LRB): CATARACT EXTRACTION PHACO AND INTRAOCULAR LENS PLACEMENT (IOC) (Left)  Patient location during evaluation: Short Stay Anesthesia Type: MAC Level of consciousness: awake and alert and oriented Pain management: pain level controlled Vital Signs Assessment: post-procedure vital signs reviewed and stable Respiratory status: spontaneous breathing, nonlabored ventilation and respiratory function stable Cardiovascular status: blood pressure returned to baseline and stable Postop Assessment: no headache, adequate PO intake, patient able to bend at knees and no signs of nausea or vomiting Anesthetic complications: no     Last Vitals:  Vitals:   09/25/16 1410 09/25/16 1438  BP:  (!) 141/79  Pulse:  99  Resp: 16 16  Temp:  36.5 C    Last Pain:  Vitals:   09/25/16 1438  TempSrc: Oral                 Tristian Sickinger

## 2016-09-25 NOTE — Discharge Instructions (Signed)
Moderate Conscious Sedation, Adult, Care After  These instructions provide you with information about caring for yourself after your procedure. Your health care provider may also give you more specific instructions. Your treatment has been planned according to current medical practices, but problems sometimes occur. Call your health care provider if you have any problems or questions after your procedure.  What can I expect after the procedure?  After your procedure, it is common:   To feel sleepy for several hours.   To feel clumsy and have poor balance for several hours.   To have poor judgment for several hours.   To vomit if you eat too soon.    Follow these instructions at home:  For at least 24 hours after the procedure:     Do not:  ? Participate in activities where you could fall or become injured.  ? Drive.  ? Use heavy machinery.  ? Drink alcohol.  ? Take sleeping pills or medicines that cause drowsiness.  ? Make important decisions or sign legal documents.  ? Take care of children on your own.   Rest.  Eating and drinking   Follow the diet recommended by your health care provider.   If you vomit:  ? Drink water, juice, or soup when you can drink without vomiting.  ? Make sure you have little or no nausea before eating solid foods.  General instructions   Have a responsible adult stay with you until you are awake and alert.   Take over-the-counter and prescription medicines only as told by your health care provider.   If you smoke, do not smoke without supervision.   Keep all follow-up visits as told by your health care provider. This is important.  Contact a health care provider if:   You keep feeling nauseous or you keep vomiting.   You feel light-headed.   You develop a rash.   You have a fever.  Get help right away if:   You have trouble breathing.  This information is not intended to replace advice given to you by your health care provider. Make sure you discuss any questions you have  with your health care provider.  Document Released: 01/08/2013 Document Revised: 08/23/2015 Document Reviewed: 07/10/2015  Elsevier Interactive Patient Education  2018 [employer]

## 2016-09-25 NOTE — Op Note (Signed)
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  Assistants: None  Anesthesia: Topical with MAC  Indications: Painless, progressive loss of vision with compromise of daily activities.  Surgery: Cataract Extraction with Intraocular lens Implant Left Eye  Discription: The patient had dilating drops and viscous lidocaine placed into the Left eye in the pre-op holding area. After transfer to the operating room, a time out was performed. The patient was then prepped and draped. Beginning with a 66m blade a paracentesis port was made at the surgeon's 2 o'clock position. The anterior chamber was then filled with 1% non-preserved lidocaine. This was followed by filling the anterior chamber with Provisc.  A 2.464mkeratome blade was used to make a clear corneal incision at the temporal limbus.  A bent cystatome needle was used to create a continuous tear capsulotomy. Hydrodissection was performed with balanced salt solution on a Fine canula. The lens nucleus was then removed using the phacoemulsification handpiece. Residual cortex was removed with the I&A handpiece. The anterior chamber and capsular bag were refilled with Provisc. A posterior chamber intraocular lens was placed into the capsular bag with it's injector. The implant was positioned with the Kuglan hook. The Provisc was then removed from the anterior chamber and capsular bag with the I&A handpiece. Stromal hydration of the main incision and paracentesis port was performed with BSS on a Fine canula. The wounds were tested for leak which was negative. The patient tolerated the procedure well. There were no operative complications. The patient was then transferred to the recovery room in stable condition.  Complications: None  Specimen: None  EBL: None  Prosthetic device: Abbott Technis, PCB00, power 22.5, SN  [id number]

## 2016-09-25 NOTE — H&P (Signed)
I have reviewed the H&P, the patient was re-examined, and I have identified no interval changes in medical condition and plan of care since the history and physical of record  

## 2016-09-26 ENCOUNTER — Encounter (HOSPITAL_COMMUNITY): Payer: Self-pay | Admitting: Ophthalmology

## 2016-09-26 NOTE — Addendum Note (Signed)
Addendum  created 09/26/16 0821 by Schultz, John R, MD   Anesthesia Attestations filed    

## 2016-10-31 ENCOUNTER — Other Ambulatory Visit: Payer: Self-pay | Admitting: "Endocrinology

## 2019-02-07 ENCOUNTER — Inpatient Hospital Stay (HOSPITAL_COMMUNITY)
Admission: EM | Admit: 2019-02-07 | Discharge: 2019-03-04 | DRG: 871 | Disposition: E | Payer: Medicare Other | Attending: Internal Medicine | Admitting: Internal Medicine

## 2019-02-07 ENCOUNTER — Encounter (HOSPITAL_COMMUNITY): Payer: Self-pay | Admitting: Emergency Medicine

## 2019-02-07 ENCOUNTER — Emergency Department (HOSPITAL_COMMUNITY): Payer: Medicare Other

## 2019-02-07 ENCOUNTER — Other Ambulatory Visit: Payer: Self-pay

## 2019-02-07 DIAGNOSIS — Z20828 Contact with and (suspected) exposure to other viral communicable diseases: Secondary | ICD-10-CM | POA: Diagnosis present

## 2019-02-07 DIAGNOSIS — G600 Hereditary motor and sensory neuropathy: Secondary | ICD-10-CM | POA: Diagnosis present

## 2019-02-07 DIAGNOSIS — F1010 Alcohol abuse, uncomplicated: Secondary | ICD-10-CM | POA: Diagnosis present

## 2019-02-07 DIAGNOSIS — I10 Essential (primary) hypertension: Secondary | ICD-10-CM | POA: Diagnosis present

## 2019-02-07 DIAGNOSIS — Z515 Encounter for palliative care: Secondary | ICD-10-CM | POA: Diagnosis present

## 2019-02-07 DIAGNOSIS — A419 Sepsis, unspecified organism: Secondary | ICD-10-CM | POA: Diagnosis not present

## 2019-02-07 DIAGNOSIS — N179 Acute kidney failure, unspecified: Secondary | ICD-10-CM | POA: Diagnosis present

## 2019-02-07 DIAGNOSIS — Z8249 Family history of ischemic heart disease and other diseases of the circulatory system: Secondary | ICD-10-CM

## 2019-02-07 DIAGNOSIS — K7460 Unspecified cirrhosis of liver: Secondary | ICD-10-CM | POA: Diagnosis not present

## 2019-02-07 DIAGNOSIS — Z9842 Cataract extraction status, left eye: Secondary | ICD-10-CM

## 2019-02-07 DIAGNOSIS — Z9841 Cataract extraction status, right eye: Secondary | ICD-10-CM

## 2019-02-07 DIAGNOSIS — K266 Chronic or unspecified duodenal ulcer with both hemorrhage and perforation: Secondary | ICD-10-CM | POA: Diagnosis present

## 2019-02-07 DIAGNOSIS — K7200 Acute and subacute hepatic failure without coma: Secondary | ICD-10-CM | POA: Diagnosis present

## 2019-02-07 DIAGNOSIS — R6521 Severe sepsis with septic shock: Secondary | ICD-10-CM | POA: Diagnosis present

## 2019-02-07 DIAGNOSIS — K631 Perforation of intestine (nontraumatic): Secondary | ICD-10-CM | POA: Diagnosis present

## 2019-02-07 DIAGNOSIS — Z79899 Other long term (current) drug therapy: Secondary | ICD-10-CM

## 2019-02-07 DIAGNOSIS — K219 Gastro-esophageal reflux disease without esophagitis: Secondary | ICD-10-CM | POA: Diagnosis present

## 2019-02-07 DIAGNOSIS — M25512 Pain in left shoulder: Secondary | ICD-10-CM | POA: Diagnosis present

## 2019-02-07 DIAGNOSIS — Z87442 Personal history of urinary calculi: Secondary | ICD-10-CM

## 2019-02-07 DIAGNOSIS — Z961 Presence of intraocular lens: Secondary | ICD-10-CM | POA: Diagnosis present

## 2019-02-07 DIAGNOSIS — Z7984 Long term (current) use of oral hypoglycemic drugs: Secondary | ICD-10-CM

## 2019-02-07 DIAGNOSIS — R188 Other ascites: Secondary | ICD-10-CM | POA: Diagnosis present

## 2019-02-07 DIAGNOSIS — F419 Anxiety disorder, unspecified: Secondary | ICD-10-CM | POA: Diagnosis present

## 2019-02-07 DIAGNOSIS — R198 Other specified symptoms and signs involving the digestive system and abdomen: Secondary | ICD-10-CM

## 2019-02-07 DIAGNOSIS — G8929 Other chronic pain: Secondary | ICD-10-CM | POA: Diagnosis present

## 2019-02-07 DIAGNOSIS — Z66 Do not resuscitate: Secondary | ICD-10-CM | POA: Diagnosis present

## 2019-02-07 DIAGNOSIS — E1165 Type 2 diabetes mellitus with hyperglycemia: Secondary | ICD-10-CM | POA: Diagnosis present

## 2019-02-07 DIAGNOSIS — M109 Gout, unspecified: Secondary | ICD-10-CM | POA: Diagnosis present

## 2019-02-07 DIAGNOSIS — R161 Splenomegaly, not elsewhere classified: Secondary | ICD-10-CM | POA: Diagnosis present

## 2019-02-07 DIAGNOSIS — M549 Dorsalgia, unspecified: Secondary | ICD-10-CM | POA: Diagnosis present

## 2019-02-07 DIAGNOSIS — Z9289 Personal history of other medical treatment: Secondary | ICD-10-CM

## 2019-02-07 DIAGNOSIS — J9601 Acute respiratory failure with hypoxia: Secondary | ICD-10-CM | POA: Diagnosis present

## 2019-02-07 DIAGNOSIS — E11649 Type 2 diabetes mellitus with hypoglycemia without coma: Secondary | ICD-10-CM | POA: Diagnosis present

## 2019-02-07 DIAGNOSIS — K766 Portal hypertension: Secondary | ICD-10-CM | POA: Diagnosis present

## 2019-02-07 DIAGNOSIS — J449 Chronic obstructive pulmonary disease, unspecified: Secondary | ICD-10-CM | POA: Diagnosis present

## 2019-02-07 LAB — URINALYSIS, ROUTINE W REFLEX MICROSCOPIC
Bilirubin Urine: NEGATIVE
Glucose, UA: 50 mg/dL — AB
Ketones, ur: NEGATIVE mg/dL
Leukocytes,Ua: NEGATIVE
Nitrite: NEGATIVE
Protein, ur: 100 mg/dL — AB
RBC / HPF: >50 RBC/hpf — ABNORMAL HIGH (ref 0–5)
Specific Gravity, Urine: 1.027 (ref 1.005–1.030)
pH: 5 (ref 5.0–8.0)

## 2019-02-07 LAB — CBC WITH DIFFERENTIAL/PLATELET
Abs Immature Granulocytes: 0.06 10*3/uL (ref 0.00–0.07)
Basophils Absolute: 0 10*3/uL (ref 0.0–0.1)
Basophils Relative: 0 %
Eosinophils Absolute: 0 10*3/uL (ref 0.0–0.5)
Eosinophils Relative: 0 %
HCT: 50.8 % (ref 39.0–52.0)
Hemoglobin: 15.4 g/dL (ref 13.0–17.0)
Immature Granulocytes: 1 %
Lymphocytes Relative: 11 %
Lymphs Abs: 0.7 10*3/uL (ref 0.7–4.0)
MCH: 28.3 pg (ref 26.0–34.0)
MCHC: 30.3 g/dL (ref 30.0–36.0)
MCV: 93.4 fL (ref 80.0–100.0)
Monocytes Absolute: 0.8 10*3/uL (ref 0.1–1.0)
Monocytes Relative: 12 %
Neutro Abs: 5 10*3/uL (ref 1.7–7.7)
Neutrophils Relative %: 76 %
Platelets: 300 10*3/uL (ref 150–400)
RBC: 5.44 MIL/uL (ref 4.22–5.81)
RDW: 13.5 % (ref 11.5–15.5)
WBC: 6.8 10*3/uL (ref 4.0–10.5)
nRBC: 0 % (ref 0.0–0.2)

## 2019-02-07 LAB — PROTIME-INR
INR: 1.4 — ABNORMAL HIGH (ref 0.8–1.2)
Prothrombin Time: 16.7 seconds — ABNORMAL HIGH (ref 11.4–15.2)

## 2019-02-07 LAB — COMPREHENSIVE METABOLIC PANEL
ALT: <5 U/L (ref 0–44)
AST: 28 U/L (ref 15–41)
Albumin: 2.6 g/dL — ABNORMAL LOW (ref 3.5–5.0)
Alkaline Phosphatase: 46 U/L (ref 38–126)
Anion gap: >20 — ABNORMAL HIGH (ref 5–15)
BUN: 23 mg/dL (ref 8–23)
CO2: 19 mmol/L — ABNORMAL LOW (ref 22–32)
Calcium: 9.3 mg/dL (ref 8.9–10.3)
Chloride: 87 mmol/L — ABNORMAL LOW (ref 98–111)
Creatinine, Ser: 2.11 mg/dL — ABNORMAL HIGH (ref 0.61–1.24)
GFR calc Af Amer: 38 mL/min — ABNORMAL LOW (ref >60)
GFR calc non Af Amer: 33 mL/min — ABNORMAL LOW (ref >60)
Glucose, Bld: 434 mg/dL — ABNORMAL HIGH (ref 70–99)
Potassium: 3.6 mmol/L (ref 3.5–5.1)
Sodium: 127 mmol/L — ABNORMAL LOW (ref 135–145)
Total Bilirubin: 2.8 mg/dL — ABNORMAL HIGH (ref 0.3–1.2)
Total Protein: 6.9 g/dL (ref 6.5–8.1)

## 2019-02-07 LAB — APTT: aPTT: 38 seconds — ABNORMAL HIGH (ref 24–36)

## 2019-02-07 LAB — LACTIC ACID, PLASMA: Lactic Acid, Venous: 9.7 mmol/L (ref 0.5–1.9)

## 2019-02-07 LAB — CBG MONITORING, ED: Glucose-Capillary: 434 mg/dL — ABNORMAL HIGH (ref 70–99)

## 2019-02-07 MED ORDER — SODIUM CHLORIDE 0.9 % IV BOLUS
1000.0000 mL | Freq: Once | INTRAVENOUS | Status: AC
  Administered 2019-02-07: 23:00:00 1000 mL via INTRAVENOUS

## 2019-02-07 MED ORDER — METOCLOPRAMIDE HCL 5 MG/ML IJ SOLN
5.0000 mg | Freq: Once | INTRAMUSCULAR | Status: AC
  Administered 2019-02-07: 23:00:00 5 mg via INTRAVENOUS
  Filled 2019-02-07: qty 2

## 2019-02-07 MED ORDER — SODIUM CHLORIDE 0.9 % IV BOLUS
1000.0000 mL | Freq: Once | INTRAVENOUS | Status: AC
  Administered 2019-02-07: 22:00:00 1000 mL via INTRAVENOUS

## 2019-02-07 MED ORDER — METRONIDAZOLE IN NACL 5-0.79 MG/ML-% IV SOLN
500.0000 mg | Freq: Once | INTRAVENOUS | Status: AC
  Administered 2019-02-07: 23:00:00 500 mg via INTRAVENOUS
  Filled 2019-02-07: qty 100

## 2019-02-07 MED ORDER — SODIUM CHLORIDE 0.9 % IV SOLN
2.0000 g | Freq: Two times a day (BID) | INTRAVENOUS | Status: DC
  Administered 2019-02-08: 12:00:00 2 g via INTRAVENOUS
  Filled 2019-02-07: qty 2

## 2019-02-07 MED ORDER — SODIUM CHLORIDE 0.9 % IV BOLUS
1000.0000 mL | Freq: Once | INTRAVENOUS | Status: AC
  Administered 2019-02-08: 1000 mL via INTRAVENOUS

## 2019-02-07 MED ORDER — SODIUM CHLORIDE 0.9 % IV SOLN
2.0000 g | Freq: Once | INTRAVENOUS | Status: AC
  Administered 2019-02-07: 23:00:00 2 g via INTRAVENOUS
  Filled 2019-02-07: qty 2

## 2019-02-07 NOTE — ED Notes (Signed)
CRITICAL VALUE ALERT  Critical Value:  Lactic Acid 9.7  Date & Time Notied:  02/23/2019 & 2242 hrs  Provider Notified: Trixie Deis. PA  Orders Received/Actions taken: N/A

## 2019-02-07 NOTE — Progress Notes (Addendum)
Pharmacy Antibiotic Note  Dwayne Davies. is a 63 y.o. male admitted on 02/23/2019 with intra-abdominal infection.  Pharmacy has been consulted for cefepime dosing.  Plan: Start cefepime 2g IV q12h Pharmacy will continue to monitor cultures, labs and patient progress.    Height: 5\' 5"  (165.1 cm) Weight: 219 lb (99.3 kg) IBW/kg (Calculated) : 61.5  Temp (24hrs), Avg:97.7 F (36.5 C), Min:97.7 F (36.5 C), Max:97.7 F (36.5 C)  Recent Labs  Lab 02/14/2019 2140 02/26/2019 2156  WBC 6.8  --   CREATININE 2.11*  --   LATICACIDVEN  --  9.7*    Estimated Creatinine Clearance: 39.3 mL/min (A) (by C-G formula based on SCr of 2.11 mg/dL (H)).    No Known Allergies  Antimicrobials this admission: metronidazole 11/6 >> x1 dose cefepime 11/6>>    Dose adjustments this admission: cefepime  Microbiology results: 11/6  [facility] x2:  11/6 UCx:      Thank you for allowing pharmacy to participate in this patient's care.  Despina Pole 02/17/2019 10:39 PM

## 2019-02-07 NOTE — ED Triage Notes (Signed)
Pt arrives via [facility] c/o fall 2 days ago, along with N/V x2 days. Pt also c/o RLQ pain that started today. CBG was 456 for EMS.  EMS gave pt 500 ml bolus and 4mg  zofran. Recheck of CBG was 380.

## 2019-02-07 NOTE — ED Provider Notes (Signed)
[facility] EMERGENCY DEPARTMENT Provider Note   CSN: [id number] Arrival date & time: Mar 08, 2019  2103     History   Chief Complaint Chief Complaint  Patient presents with   Hyperglycemia    HPI Dwayne Deriso. is a 63 y.o. male history diabetes, COPD, kidney stones, GERD, gout, gastric ulcer, morbid obesity presents today via EMS.  Patient reports 3 days ago he began feeling unwell.  He reports that he developed nausea with nonbloody/nonbilious emesis and generalized abdominal pain worse in the right lower quadrant, nonradiating, severe sharp throb without clear aggravating or alleviating factors.  Reports history of similar pain in the past due to kidney stone.  On EMS arrival patient found to be hyperglycemic at 456 and was given 500 mL fluid bolus and 4 mg Zofran.  On arrival to ED patient found to be hypoxic with SPO2 in the upper 80s was placed on supplemental nasal cannula at 2 L/min with marginal improvement to low 90s.  Patient reports fever at home however has not measured temperature has been treating intermittently with Tylenol last dose around noon today, unknown dosage.  Additionally reports generalized fatigue and body aches.  Denies headache, neck pain, chest pain/shortness of breath, dysuria/hematuria, testicular pain/swelling, extremity pain/weakness or any additional concerns.  Triage note mentions a fall 2 days ago, patient did not mention this to me, initial examination on subsequent discussion patient reports that he tripped in a hole 2 days ago falling onto his left side he denies any injury from this event in particular he denies any head injury, loss of consciousness or pain.     HPI  Past Medical History:  Diagnosis Date   Anxiety    Charcot-Marie-Tooth disease    Chest pain    Chronic back pain    Chronic pain in left shoulder    COPD (chronic obstructive pulmonary disease) (HCC)    Diabetes mellitus    Elevated lipids    GERD  (gastroesophageal reflux disease)    Gout    Gout    History of kidney stones    Hypertension    Normal cardiac stress test 2000, 2005   Shortness of breath     Patient Active Problem List   Diagnosis Date Noted   Duodenal ulcer due to nonsteroidal anti-inflammatory drug (NSAID) 11/09/2015   Barrett's esophagus 11/09/2015   Hypovolemic shock ([place]) 11/05/2015   Acute blood loss anemia 11/05/2015   Tear of skin of buttock 11/05/2015   Rectal bleeding 11/04/2015   Lactic acidosis 11/04/2015   Elevated serum lactate dehydrogenase    Essential hypertension, benign 04/02/2015   Morbid obesity due to excess calories ([place]) 04/02/2015   Type II diabetes mellitus, uncontrolled ([place]) 12/15/2009   DYSPNEA 12/15/2009   CHEST PAIN 12/15/2009    Past Surgical History:  Procedure Laterality Date   CARDIAC CATHETERIZATION     x2 (last 2001), both normal coronary arteries   CATARACT EXTRACTION W/PHACO  04/08/2012   Procedure: CATARACT EXTRACTION PHACO AND INTRAOCULAR LENS PLACEMENT ([place]);  Surgeon: Williams Che, MD;  Location: [facility] ORS;  Service: Ophthalmology;  Laterality: Right;  CDE:40.75   CATARACT EXTRACTION W/PHACO Left 09/25/2016   Procedure: CATARACT EXTRACTION PHACO AND INTRAOCULAR LENS PLACEMENT (I[facility]);  Surgeon: Tonny Branch, MD;  Location: [facility] ORS;  Service: Ophthalmology;  Laterality: Left;  CDE:  19.49   ESOPHAGOGASTRODUODENOSCOPY (EGD) WITH PROPOFOL N/A 11/05/2015   Procedure: ESOPHAGOGASTRODUODENOSCOPY (EGD) WITH PROPOFOL;  Surgeon: Rogene Houston, MD;  Location: [facility] ENDO SUITE;  Service:  Endoscopy;  Laterality: N/A;   ESOPHAGOGASTRODUODENOSCOPY (EGD) WITH PROPOFOL N/A 11/08/2015   Procedure: ESOPHAGOGASTRODUODENOSCOPY (EGD) WITH PROPOFOL;  Surgeon: Malissa Hippo, MD;  Location: [facility] ENDO SUITE;  Service: Endoscopy;  Laterality: N/A;        Home Medications    Prior to Admission medications   Medication Sig Start Date End Date Taking? Authorizing Provider    albuterol (PROVENTIL HFA;VENTOLIN HFA) 108 (90 BASE) MCG/ACT inhaler Inhale 2 puffs into the lungs every 6 (six) hours as needed for wheezing or shortness of breath. Sho   Yes [provider]  atorvastatin (LIPITOR) 10 MG tablet Take 10 mg by mouth at bedtime. 08/12/18  Yes [provider]  colchicine 0.6 MG tablet Take 0.6 mg by mouth 2 (two) times daily as needed (gout).    Yes [provider]  furosemide (LASIX) 40 MG tablet Take 40 mg by mouth daily.    Yes [provider]  lisinopril (ZESTRIL) 20 MG tablet Take 20 mg by mouth daily. 08/12/18  Yes [provider]  Magnesium Oxide 500 MG CAPS Take 500 mg by mouth daily.   Yes [provider]  Meclizine HCl 25 MG CHEW Chew 25-50 mg by mouth daily as needed (dizziness).   Yes [provider]  metFORMIN (GLUCOPHAGE) 1000 MG tablet Take 1 tablet (1,000 mg total) by mouth 2 (two) times daily with a meal. 06/14/15  Yes Nida, Denman George, MD  Omega-3 Fatty Acids (FISH OIL PO) Take 1 capsule by mouth daily.   Yes [provider]  tamsulosin (FLOMAX) 0.4 MG CAPS capsule Take 0.4 mg by mouth daily.    Yes [provider]  VITAMIN E PO Take 1 capsule by mouth daily.   Yes [provider]  Blood Glucose Monitoring Suppl (ACCU-CHEK AVIVA) device Use as instructed 03/10/15   Roma Kayser, MD  glucose blood (ACCU-CHEK AVIVA) test strip Use to test glucose 4 times a day. 03/10/15   Roma Kayser, MD  ipratropium-albuterol (DUONEB) 0.5-2.5 (3) MG/3ML SOLN Take 3 mLs by nebulization 4 (four) times daily as needed (shortness of breath.).    [provider]    Family History Family History  Problem Relation Age of Onset   CAD Father    Coronary artery disease Neg Hx     Social History Social History   Tobacco Use   Smoking status: Never Smoker   Smokeless tobacco: Never Used  Substance Use Topics   Alcohol use: No   Drug use: No      Allergies   Patient has no known allergies.   Review of Systems Review of Systems Ten systems are reviewed and are negative for acute change except as noted in the HPI   Physical Exam Updated Vital Signs BP 112/73    Pulse (!) 138    Temp 98.3 F (36.8 C) (Oral)    Resp (!) 24    Ht  (1.651 m)    Wt 99.3 kg    SpO2 90%    BMI 36.44 kg/m   Physical Exam Constitutional:      General: He is not in acute distress.    Appearance: Normal appearance. He is well-developed. He is obese. He is ill-appearing. He is not diaphoretic.  HENT:     Head: Normocephalic and atraumatic.     Right Ear: External ear normal.     Left Ear: External ear normal.     Nose: Nose normal.  Eyes:  General: Vision grossly intact. Gaze aligned appropriately.     Pupils: Pupils are equal, round, and reactive to light.  Neck:     Musculoskeletal: Normal range of motion.     Trachea: Trachea and phonation normal. No tracheal deviation.  Cardiovascular:     Rate and Rhythm: Regular rhythm. Tachycardia present.  Pulmonary:     Effort: Pulmonary effort is normal. No respiratory distress.  Abdominal:     Palpations: Abdomen is soft.     Tenderness: There is abdominal tenderness. There is guarding. There is no rebound.  Genitourinary:    Comments: Deferred by patient Musculoskeletal: Normal range of motion.  Skin:    General: Skin is warm and dry.  Neurological:     Mental Status: He is alert.     GCS: GCS eye subscore is 4. GCS verbal subscore is 5. GCS motor subscore is 6.     Comments: Speech is clear and goal oriented, follows commands Major Cranial nerves without deficit, no facial droop Moves extremities without ataxia, coordination intact  Psychiatric:        Behavior: Behavior normal.      ED Treatments / Results  Labs (all labs ordered are listed, but only abnormal results are displayed) Labs Reviewed  URINALYSIS, ROUTINE W REFLEX MICROSCOPIC - Abnormal; Notable for the  following components:      Result Value   Color, Urine AMBER (*)    APPearance TURBID (*)    Glucose, UA 50 (*)    Hgb urine dipstick LARGE (*)    Protein, ur 100 (*)    RBC / HPF >50 (*)    Bacteria, UA RARE (*)    Non Squamous Epithelial 0-5 (*)    All other components within normal limits  LACTIC ACID, PLASMA - Abnormal; Notable for the following components:   Lactic Acid, Venous 9.7 (*)    All other components within normal limits  COMPREHENSIVE METABOLIC PANEL - Abnormal; Notable for the following components:   Sodium 127 (*)    Chloride 87 (*)    CO2 19 (*)    Glucose, Bld 434 (*)    Creatinine, Ser 2.11 (*)    Albumin 2.6 (*)    Total Bilirubin 2.8 (*)    GFR calc non Af Amer 33 (*)    GFR calc Af Amer 38 (*)    Anion gap >20 (*)    All other components within normal limits  APTT - Abnormal; Notable for the following components:   aPTT 38 (*)    All other components within normal limits  PROTIME-INR - Abnormal; Notable for the following components:   Prothrombin Time 16.7 (*)    INR 1.4 (*)    All other components within normal limits  CBG MONITORING, ED - Abnormal; Notable for the following components:   Glucose-Capillary 434 (*)    All other components within normal limits  CULTURE, BLOOD (ROUTINE X 2)  CULTURE, BLOOD (ROUTINE X 2)  URINE CULTURE  CBC WITH DIFFERENTIAL/PLATELET  LACTIC ACID, PLASMA  TYPE AND SCREEN    EKG EKG Interpretation  Date/Time:  Friday 02/24/2019 21:48:45 EST Ventricular Rate:  112 PR Interval:    QRS Duration: 91 QT Interval:  363 QTC Calculation: 496 R Axis:   81 Text Interpretation: Sinus tachycardia Borderline right axis deviation Borderline T wave abnormalities Borderline prolonged QT interval Confirmed by Blane Ohara [phone]) on 02/24/19 11:09:29 PM   Radiology Ct Abdomen Pelvis Wo Contrast  Result Date: 02/08/2019  CLINICAL DATA:  Abdominal pain. Right lower quadrant abdominal pain. EXAM: CT ABDOMEN AND  PELVIS WITHOUT CONTRAST TECHNIQUE: Multidetector CT imaging of the abdomen and pelvis was performed following the standard protocol without IV contrast. COMPARISON:  November 04, 2015 FINDINGS: Lower chest: The lung bases are clear.The heart size is significantly enlarged. There is atelectasis at the lung bases. Hepatobiliary: The liver demonstrates a nodular contour. There is cholelithiasis. Gallbladder wall thickening is noted.There is no biliary ductal dilation. Pancreas: Normal contours without ductal dilatation. No peripancreatic fluid collection. Spleen: The spleen is enlarged measuring 14 cm craniocaudad. Adrenals/Urinary Tract: --Adrenal glands: No adrenal hemorrhage. --Right kidney/ureter: Multiple nonobstructing stones are noted. --Left kidney/ureter: Multiple nonobstructing stones are noted. --Urinary bladder: There is bladder wall thickening which is favored to be secondary to underdistention. There are few pockets of gas within the urinary bladder lumen. Stomach/Bowel: --Stomach/Duodenum: There is diffuse wall thickening of the distal esophagus with a moderate-sized hiatal hernia. There is some mild fat stranding wall thickening of the proximal duodenum. --Small bowel: There is no small bowel obstruction. There is some wall thickening of multiple loops of jejunum in the mid left abdomen. --Colon: Rectosigmoid diverticulosis without acute inflammation. There is some wall thickening of the cecum. --Appendix: The appendix is not reliably identified. There is a surgical clip in the right lower quadrant which may indicate the patient has undergone prior cholecystectomy. Vascular/Lymphatic: Atherosclerotic calcification is present within the non-aneurysmal abdominal aorta, without hemodynamically significant stenosis. There is portosystemic shunting. --there are few prominent retroperitoneal lymph nodes. --there are borderline enlarged mesenteric lymph nodes. --No pelvic or inguinal lymphadenopathy.  Reproductive: The prostate gland is enlarged. Other: There is a small volume of pneumoperitoneum. There is a moderate to large volume of abdominal ascites. There is some nodularity of the omentum. Musculoskeletal. No acute displaced fractures. IMPRESSION: 1. Small volume pneumoperitoneum consistent with a hollow viscus perforation. The exact location of the perforation cannot be determined on this exam. Of note, the patient's CT from 2017 demonstrated findings concerning for a duodenal ulcer. There is some mild wall thickening of the proximal duodenum, however this is not well evaluated in the absence of IV contrast. 2. Cirrhosis with sequela of portal hypertension including splenomegaly, moderate to large volume of abdominal ascites, and portosystemic shunting. 3. Bilateral nonobstructing nephrolithiasis. 4. There is cholelithiasis without secondary signs of acute cholecystitis. 5. Nodularity of the omentum favored to be secondary to the presence of ascites. Follow-up is recommended. Consider fluid sampling of the ascites to help rule out a process such is omental carcinomatosis. 6. Cardiomegaly. 7. Diffuse wall thickening of the distal esophagus may be secondary to esophagitis. A moderate-sized hiatal hernia is noted. 8. Additional chronic findings as above. These results were called by telephone at the time of interpretation on 02/08/2019 at 12:14 am to provider [facility] , who verbally acknowledged these results. Electronically Signed   By: Katherine Mantle M.D.   On: 02/08/2019 00:16   Dg Chest Portable 1 View  Result Date: 02/14/2019 CLINICAL DATA:  Hypoxia, tachycardia EXAM: PORTABLE CHEST 1 VIEW COMPARISON:  March 08, 2010 FINDINGS: There is moderate cardiomegaly. Aortic knob calcifications. Pulmonary vascular congestion is seen in the perihilar regions. There is a probable trace right and small left pleural effusion. Hazy airspace opacity seen at both lung bases. No acute osseous abnormality.  IMPRESSION: Cardiomegaly and pulmonary vascular congestion. Trace right and small left pleural effusion. Hazy airspace opacity at both lung bases which could be layering effusion, early infectious etiology and/or  atelectasis. Electronically Signed   By: Jonna Clark M.D.   On: 02/06/2019 23:08    Procedures .Critical Care Performed by: Bill Salinas, PA-C Authorized by: Bill Salinas, PA-C   Critical care provider statement:    Critical care time (minutes):  62   Critical care was necessary to treat or prevent imminent or life-threatening deterioration of the following conditions:  Sepsis and metabolic crisis   Critical care was time spent personally by me on the following activities:  Discussions with consultants, evaluation of patient's response to treatment, examination of patient, ordering and performing treatments and interventions, ordering and review of laboratory studies, ordering and review of radiographic studies, pulse oximetry, re-evaluation of patient's condition, obtaining history from patient or surrogate, review of old charts and development of treatment plan with patient or surrogate   (including critical care time)  Medications Ordered in ED Medications  ceFEPIme (MAXIPIME) 2 g in sodium chloride 0.9 % 100 mL IVPB (has no administration in time range)  sodium chloride 0.9 % bolus 1,000 mL (0 mLs Intravenous Stopped 02/11/2019 2242)  ceFEPIme (MAXIPIME) 2 g in sodium chloride 0.9 % 100 mL IVPB (0 g Intravenous Stopped 02/26/2019 2302)  metroNIDAZOLE (FLAGYL) IVPB 500 mg (0 mg Intravenous Stopped 02/08/19 0024)  sodium chloride 0.9 % bolus 1,000 mL (0 mLs Intravenous Stopped 02/08/19 0024)  metoCLOPramide (REGLAN) injection 5 mg (5 mg Intravenous Given 02/18/2019 2239)  sodium chloride 0.9 % bolus 1,000 mL (1,000 mLs Intravenous New Bag/Given 02/08/19 0024)  metoCLOPramide (REGLAN) injection 5 mg (5 mg Intravenous Given 02/08/19 0023)     Initial Impression / Assessment and  Plan / ED Course  I have reviewed the triage vital signs and the nursing notes.  Pertinent labs & imaging results that were available during my care of the patient were reviewed by me and considered in my medical decision making (see chart for details).  63 year old male arrives ill-appearing, tachypneic, tachycardic, hypotensive and hypoxic with SPO2 in the upper 80s improved following nasal cannula.  He has 3 days of abdominal pain nausea vomiting diarrhea, high suspicion for infection at this time code sepsis initiated, additionally labs ordered will evaluate for possible DKA.  However with patient's focal tenderness in the right lower quadrant I am concerned for possible appendicitis among other intra-abdominal pathologies will obtain CT of the abdomen and pelvis.  I have asked for patient to be transferred to a room and placed on monitor. - Lactic 9.7 CBG 434 CMP sodium 127, glucose 434, bicarb 19, creatinine 2.1, anion gap greater than 20, bilirubin 2.8 PT/INR elevated APTT elevated CBC nonacute Urinalysis shows greater than 50 RBCs, 21-50 WBCs, rare bacteria, leukocyte and nitrite negative Chest x-ray:  IMPRESSION:  Cardiomegaly and pulmonary vascular congestion.    Trace right and small left pleural effusion.    Hazy airspace opacity at both lung bases which could be layering  effusion, early infectious etiology and/or atelectasis.  - Originally CT abdomen pelvis with contrast was ordered however patient appears to have new AKI, will need to proceed with CT noncontrast.  Patient seen and evaluated by Dr. Jodi Mourning who agrees with work-up as above.   - Third liter of fluid was ordered to complete 30 cc/kg bolus. - Patient with continued nausea and vomiting, additional Reglan ordered. - CTAP: IMPRESSION:  1. Small volume pneumoperitoneum consistent with a hollow viscus  perforation. The exact location of the perforation cannot be  determined on this exam. Of note, the patient's  CT from 2017  demonstrated findings concerning for a duodenal ulcer. There is some  mild wall thickening of the proximal duodenum, however this is not  well evaluated in the absence of IV contrast.  2. Cirrhosis with sequela of portal hypertension including  splenomegaly, moderate to large volume of abdominal ascites, and  portosystemic shunting.  3. Bilateral nonobstructing nephrolithiasis.  4. There is cholelithiasis without secondary signs of acute  cholecystitis.  5. Nodularity of the omentum favored to be secondary to the presence  of ascites. Follow-up is recommended. Consider fluid sampling of the  ascites to help rule out a process such is omental carcinomatosis.  6. Cardiomegaly.  7. Diffuse wall thickening of the distal esophagus may be secondary  to esophagitis. A moderate-sized hiatal hernia is noted.  8. Additional chronic findings as above.  - Consult placed to general surgery. - Unfortunately do not have any beds here at [facility] for the patient if he was to go to surgery here.  I then discussed the case with general surgeon Dr. Lovell Sheehan who advises that with patient's history of portal hypertension we do not have the ability to perform surgery at this hospital patient will need transfer.  Dr. Lovell Sheehan is seeing patient in consultation. - Consult placed to intensivist Dr. Violet Baldy to help with this case and to find available beds as multiple [facility] facilities are full. - Discussed case with intensivist as well as [facility], both [place] and [facility] have no ICU beds available. - Secretary contacted [facility] hospital who advises no available ICU beds or surgeons. - Patient seen and evaluated by Dr. Lovell Sheehan who advises that resuscitation and NG tube to temporize, unable to perform surgery at this facility. - 1:17 AM: Discussed case with general surgeon Dr. Janee Morn, advises patient likely not a surgical candidate, advises that if no ICU beds available at [facility] not  quite sure what we can do for him.  Advises that if patient ends up in [facility] ER at that his team will see the patient. - 1:30 AM: Rediscussed case with Dr. Violet Baldy and [facility], advised that there is still no ICU beds available at [facility] and not quite sure what the benefit would be at [facility].  We have only 2 units of blood in this entire ED and no backup at any point and feel that it would be beneficial for patient to be in a more resource rich ED where additional blood is available and nursing/physician resources. - 1:31 AM: Dr. Lynelle Doctor is calling [facility], ED for transfer of patient. - 1:34 AM: ED to ED transfer, Dr. Lynelle Doctor spoke with Dr. Eudelia Bunch at [facility].  Patient being prepped for transfer. - Patient reassessed, NG tube being placed by RN, mildly tachycardic rate 110, blood pressure stable.   Patient was seen and evaluated during this visit by Dr. Jodi Mourning.  Dwayne Haidar. was evaluated in Emergency Department on 02/08/2019 for the symptoms described in the history of present illness. He was evaluated in the context of the global COVID-19 pandemic, which necessitated consideration that the patient might be at risk for infection with the SARS-CoV-2 virus that causes COVID-19. Institutional protocols and algorithms that pertain to the evaluation of patients at risk for COVID-19 are in a state of rapid change based on information released by regulatory bodies including the CDC and federal and state organizations. These policies and algorithms were followed during the patient's care in the ED.  Note: Portions of this report may have been transcribed using voice recognition  software. Every effort was made to ensure accuracy; however, inadvertent computerized transcription errors may still be present. Final Clinical Impressions(s) / ED Diagnoses   Final diagnoses:  Perforated abdominal viscus  Sepsis with acute renal failure, due to unspecified organism, unspecified acute renal failure type,  unspecified whether septic shock present [facility](HCC)    ED Discharge Orders    None       Elizabeth PalauMorelli, Dwayne Abila A, PA-C 02/08/19 0139    Devoria AlbeKnapp, Iva, MD 02/08/19 [phone]

## 2019-02-08 ENCOUNTER — Inpatient Hospital Stay (HOSPITAL_COMMUNITY): Payer: Medicare Other

## 2019-02-08 ENCOUNTER — Emergency Department (HOSPITAL_COMMUNITY): Payer: Medicare Other

## 2019-02-08 DIAGNOSIS — K266 Chronic or unspecified duodenal ulcer with both hemorrhage and perforation: Secondary | ICD-10-CM | POA: Diagnosis present

## 2019-02-08 DIAGNOSIS — R0600 Dyspnea, unspecified: Secondary | ICD-10-CM

## 2019-02-08 DIAGNOSIS — R6520 Severe sepsis without septic shock: Secondary | ICD-10-CM

## 2019-02-08 DIAGNOSIS — M25512 Pain in left shoulder: Secondary | ICD-10-CM | POA: Diagnosis present

## 2019-02-08 DIAGNOSIS — R198 Other specified symptoms and signs involving the digestive system and abdomen: Secondary | ICD-10-CM

## 2019-02-08 DIAGNOSIS — J449 Chronic obstructive pulmonary disease, unspecified: Secondary | ICD-10-CM | POA: Diagnosis present

## 2019-02-08 DIAGNOSIS — D689 Coagulation defect, unspecified: Secondary | ICD-10-CM | POA: Diagnosis not present

## 2019-02-08 DIAGNOSIS — R188 Other ascites: Secondary | ICD-10-CM | POA: Diagnosis present

## 2019-02-08 DIAGNOSIS — N179 Acute kidney failure, unspecified: Secondary | ICD-10-CM | POA: Diagnosis present

## 2019-02-08 DIAGNOSIS — K7460 Unspecified cirrhosis of liver: Secondary | ICD-10-CM | POA: Diagnosis present

## 2019-02-08 DIAGNOSIS — G600 Hereditary motor and sensory neuropathy: Secondary | ICD-10-CM | POA: Diagnosis present

## 2019-02-08 DIAGNOSIS — K7200 Acute and subacute hepatic failure without coma: Secondary | ICD-10-CM | POA: Diagnosis present

## 2019-02-08 DIAGNOSIS — A419 Sepsis, unspecified organism: Principal | ICD-10-CM

## 2019-02-08 DIAGNOSIS — R6521 Severe sepsis with septic shock: Secondary | ICD-10-CM | POA: Diagnosis present

## 2019-02-08 DIAGNOSIS — F1010 Alcohol abuse, uncomplicated: Secondary | ICD-10-CM | POA: Diagnosis present

## 2019-02-08 DIAGNOSIS — K7290 Hepatic failure, unspecified without coma: Secondary | ICD-10-CM | POA: Diagnosis not present

## 2019-02-08 DIAGNOSIS — M109 Gout, unspecified: Secondary | ICD-10-CM | POA: Diagnosis present

## 2019-02-08 DIAGNOSIS — J9601 Acute respiratory failure with hypoxia: Secondary | ICD-10-CM

## 2019-02-08 DIAGNOSIS — G8929 Other chronic pain: Secondary | ICD-10-CM | POA: Diagnosis present

## 2019-02-08 DIAGNOSIS — K631 Perforation of intestine (nontraumatic): Secondary | ICD-10-CM | POA: Diagnosis not present

## 2019-02-08 DIAGNOSIS — M549 Dorsalgia, unspecified: Secondary | ICD-10-CM | POA: Diagnosis present

## 2019-02-08 DIAGNOSIS — Z66 Do not resuscitate: Secondary | ICD-10-CM | POA: Diagnosis present

## 2019-02-08 DIAGNOSIS — Z961 Presence of intraocular lens: Secondary | ICD-10-CM | POA: Diagnosis present

## 2019-02-08 DIAGNOSIS — K219 Gastro-esophageal reflux disease without esophagitis: Secondary | ICD-10-CM | POA: Diagnosis present

## 2019-02-08 DIAGNOSIS — E1165 Type 2 diabetes mellitus with hyperglycemia: Secondary | ICD-10-CM | POA: Diagnosis present

## 2019-02-08 DIAGNOSIS — Z20828 Contact with and (suspected) exposure to other viral communicable diseases: Secondary | ICD-10-CM | POA: Diagnosis present

## 2019-02-08 DIAGNOSIS — K766 Portal hypertension: Secondary | ICD-10-CM | POA: Diagnosis present

## 2019-02-08 DIAGNOSIS — F419 Anxiety disorder, unspecified: Secondary | ICD-10-CM | POA: Diagnosis present

## 2019-02-08 DIAGNOSIS — Z515 Encounter for palliative care: Secondary | ICD-10-CM | POA: Diagnosis present

## 2019-02-08 DIAGNOSIS — I10 Essential (primary) hypertension: Secondary | ICD-10-CM | POA: Diagnosis present

## 2019-02-08 LAB — CBC
HCT: 51.3 % (ref 39.0–52.0)
Hemoglobin: 15.4 g/dL (ref 13.0–17.0)
MCH: 29 pg (ref 26.0–34.0)
MCHC: 30 g/dL (ref 30.0–36.0)
MCV: 96.6 fL (ref 80.0–100.0)
Platelets: 347 10*3/uL (ref 150–400)
RBC: 5.31 MIL/uL (ref 4.22–5.81)
RDW: 13.2 % (ref 11.5–15.5)
WBC: 13.9 10*3/uL — ABNORMAL HIGH (ref 4.0–10.5)
nRBC: 0 % (ref 0.0–0.2)

## 2019-02-08 LAB — POCT I-STAT 7, (LYTES, BLD GAS, ICA,H+H)
Acid-base deficit: 12 mmol/L — ABNORMAL HIGH (ref 0.0–2.0)
Acid-base deficit: 19 mmol/L — ABNORMAL HIGH (ref 0.0–2.0)
Bicarbonate: 16.1 mmol/L — ABNORMAL LOW (ref 20.0–28.0)
Bicarbonate: 8.7 mmol/L — ABNORMAL LOW (ref 20.0–28.0)
Calcium, Ion: 1.06 mmol/L — ABNORMAL LOW (ref 1.15–1.40)
Calcium, Ion: 0.96 mmol/L — ABNORMAL LOW (ref 1.15–1.40)
HCT: 46 % (ref 39.0–52.0)
HCT: 49 % (ref 39.0–52.0)
Hemoglobin: 15.6 g/dL (ref 13.0–17.0)
Hemoglobin: 16.7 g/dL (ref 13.0–17.0)
O2 Saturation: 97 %
O2 Saturation: 94 %
Patient temperature: 98.8
Patient temperature: 98.6
Potassium: 3.5 mmol/L (ref 3.5–5.1)
Potassium: 4.6 mmol/L (ref 3.5–5.1)
Sodium: 133 mmol/L — ABNORMAL LOW (ref 135–145)
Sodium: 130 mmol/L — ABNORMAL LOW (ref 135–145)
TCO2: 17 mmol/L — ABNORMAL LOW (ref 22–32)
TCO2: 9 mmol/L — ABNORMAL LOW (ref 22–32)
pCO2 arterial: 45.1 mmHg (ref 32.0–48.0)
pCO2 arterial: 25.5 mmHg — ABNORMAL LOW (ref 32.0–48.0)
pH, Arterial: 7.163 — CL (ref 7.350–7.450)
pH, Arterial: 7.142 — CL (ref 7.350–7.450)
pO2, Arterial: 115 mmHg — ABNORMAL HIGH (ref 83.0–108.0)
pO2, Arterial: 90 mmHg (ref 83.0–108.0)

## 2019-02-08 LAB — BASIC METABOLIC PANEL
Anion gap: 18 — ABNORMAL HIGH (ref 5–15)
BUN: 25 mg/dL — ABNORMAL HIGH (ref 8–23)
CO2: 16 mmol/L — ABNORMAL LOW (ref 22–32)
Calcium: 8.8 mg/dL — ABNORMAL LOW (ref 8.9–10.3)
Chloride: 98 mmol/L (ref 98–111)
Creatinine, Ser: 2.41 mg/dL — ABNORMAL HIGH (ref 0.61–1.24)
GFR calc Af Amer: 32 mL/min — ABNORMAL LOW (ref >60)
GFR calc non Af Amer: 28 mL/min — ABNORMAL LOW (ref >60)
Glucose, Bld: 237 mg/dL — ABNORMAL HIGH (ref 70–99)
Potassium: 3.7 mmol/L (ref 3.5–5.1)
Sodium: 132 mmol/L — ABNORMAL LOW (ref 135–145)

## 2019-02-08 LAB — CBG MONITORING, ED: Glucose-Capillary: 192 mg/dL — ABNORMAL HIGH (ref 70–99)

## 2019-02-08 LAB — TYPE AND SCREEN
ABO/RH(D): A POS
Antibody Screen: NEGATIVE

## 2019-02-08 LAB — GLUCOSE, CAPILLARY
Glucose-Capillary: 82 mg/dL (ref 70–99)
Glucose-Capillary: 50 mg/dL — ABNORMAL LOW (ref 70–99)
Glucose-Capillary: 53 mg/dL — ABNORMAL LOW (ref 70–99)
Glucose-Capillary: 83 mg/dL (ref 70–99)

## 2019-02-08 LAB — PROTIME-INR
INR: 2.1 — ABNORMAL HIGH (ref 0.8–1.2)
Prothrombin Time: 22.9 seconds — ABNORMAL HIGH (ref 11.4–15.2)

## 2019-02-08 LAB — ECHOCARDIOGRAM COMPLETE
Height: 65 in
Weight: 3739 oz

## 2019-02-08 LAB — HEMOGLOBIN A1C
Hgb A1c MFr Bld: 9 % — ABNORMAL HIGH (ref 4.8–5.6)
Mean Plasma Glucose: 211.6 mg/dL

## 2019-02-08 LAB — HEPATITIS PANEL, ACUTE
HCV Ab: NONREACTIVE
Hep A IgM: NONREACTIVE
Hep B C IgM: NONREACTIVE
Hepatitis B Surface Ag: NONREACTIVE

## 2019-02-08 LAB — HIV ANTIBODY (ROUTINE TESTING W REFLEX): HIV Screen 4th Generation wRfx: NONREACTIVE

## 2019-02-08 LAB — LACTIC ACID, PLASMA
Lactic Acid, Venous: 8.3 mmol/L (ref 0.5–1.9)
Lactic Acid, Venous: 8.1 mmol/L (ref 0.5–1.9)
Lactic Acid, Venous: >11 mmol/L (ref 0.5–1.9)

## 2019-02-08 LAB — AMMONIA: Ammonia: 124 umol/L — ABNORMAL HIGH (ref 9–35)

## 2019-02-08 LAB — SARS CORONAVIRUS 2 (TAT 6-24 HRS): SARS Coronavirus 2: NEGATIVE

## 2019-02-08 MED ORDER — MIDAZOLAM HCL 2 MG/2ML IJ SOLN
INTRAMUSCULAR | Status: AC
  Filled 2019-02-08: qty 2

## 2019-02-08 MED ORDER — NOREPINEPHRINE 16 MG/250ML-% IV SOLN
0.0000 ug/min | INTRAVENOUS | Status: DC
  Administered 2019-02-08: 32 ug/min via INTRAVENOUS
  Administered 2019-02-08: 15:00:00 35 ug/min via INTRAVENOUS
  Administered 2019-02-09: 06:00:00 32 ug/min via INTRAVENOUS
  Filled 2019-02-08 (×4): qty 250

## 2019-02-08 MED ORDER — SODIUM BICARBONATE 8.4 % IV SOLN
INTRAVENOUS | Status: AC
  Filled 2019-02-08: qty 50

## 2019-02-08 MED ORDER — PHENYLEPHRINE HCL-NACL 10-0.9 MG/250ML-% IV SOLN
INTRAVENOUS | Status: AC
  Filled 2019-02-08: qty 250

## 2019-02-08 MED ORDER — INSULIN ASPART 100 UNIT/ML ~~LOC~~ SOLN
0.0000 [IU] | SUBCUTANEOUS | Status: DC
  Administered 2019-02-08: 3 [IU] via SUBCUTANEOUS

## 2019-02-08 MED ORDER — CHLORHEXIDINE GLUCONATE 0.12 % MT SOLN
15.0000 mL | Freq: Two times a day (BID) | OROMUCOSAL | Status: DC
  Administered 2019-02-08: 14:00:00 15 mL via OROMUCOSAL

## 2019-02-08 MED ORDER — ALBUTEROL SULFATE (2.5 MG/3ML) 0.083% IN NEBU: 2.5000 mg | INHALATION_SOLUTION | RESPIRATORY_TRACT | Status: DC | PRN

## 2019-02-08 MED ORDER — ORAL CARE MOUTH RINSE
15.0000 mL | Freq: Two times a day (BID) | OROMUCOSAL | Status: DC
  Administered 2019-02-08 (×2): 15 mL via OROMUCOSAL

## 2019-02-08 MED ORDER — SODIUM CHLORIDE 0.9 % IV BOLUS
1000.0000 mL | Freq: Once | INTRAVENOUS | Status: AC
  Administered 2019-02-08: 1000 mL via INTRAVENOUS

## 2019-02-08 MED ORDER — DEXTROSE 50 % IV SOLN
INTRAVENOUS | Status: AC
  Administered 2019-02-08: 18:00:00 50 mL
  Filled 2019-02-08: qty 50

## 2019-02-08 MED ORDER — FUROSEMIDE 10 MG/ML IJ SOLN
60.0000 mg | Freq: Once | INTRAMUSCULAR | Status: AC
  Administered 2019-02-08: 02:00:00 60 mg via INTRAVENOUS
  Filled 2019-02-08: qty 6

## 2019-02-08 MED ORDER — FENTANYL CITRATE (PF) 100 MCG/2ML IJ SOLN
INTRAMUSCULAR | Status: AC
  Administered 2019-02-08: 10:00:00 100 ug
  Filled 2019-02-08: qty 2

## 2019-02-08 MED ORDER — FENTANYL CITRATE (PF) 100 MCG/2ML IJ SOLN
INTRAMUSCULAR | Status: AC
  Filled 2019-02-08: qty 2

## 2019-02-08 MED ORDER — SODIUM BICARBONATE 8.4 % IV SOLN
100.0000 meq | Freq: Once | INTRAVENOUS | Status: AC
  Administered 2019-02-08: 11:00:00 100 meq via INTRAVENOUS

## 2019-02-08 MED ORDER — VASOPRESSIN 20 UNIT/ML IV SOLN
0.0400 [IU]/min | INTRAVENOUS | Status: DC
  Administered 2019-02-08 – 2019-02-09 (×2): 0.04 [IU]/min via INTRAVENOUS
  Filled 2019-02-08 (×4): qty 2

## 2019-02-08 MED ORDER — CHLORHEXIDINE GLUCONATE CLOTH 2 % EX PADS
6.0000 | MEDICATED_PAD | Freq: Every day | CUTANEOUS | Status: DC
  Administered 2019-02-08: 11:00:00 6 via TOPICAL

## 2019-02-08 MED ORDER — PIPERACILLIN-TAZOBACTAM 3.375 G IVPB
3.3750 g | Freq: Three times a day (TID) | INTRAVENOUS | Status: DC
  Administered 2019-02-08 – 2019-02-09 (×2): 3.375 g via INTRAVENOUS
  Filled 2019-02-08 (×3): qty 50

## 2019-02-08 MED ORDER — PHENYLEPHRINE HCL-NACL 40-0.9 MG/250ML-% IV SOLN
25.0000 ug/min | INTRAVENOUS | Status: DC
  Administered 2019-02-08: 21:00:00 160 ug/min via INTRAVENOUS
  Administered 2019-02-08: 15:00:00 200 ug/min via INTRAVENOUS
  Administered 2019-02-08: 17:00:00 170 ug/min via INTRAVENOUS
  Administered 2019-02-09 (×3): 160 ug/min via INTRAVENOUS
  Filled 2019-02-08 (×9): qty 250

## 2019-02-08 MED ORDER — DEXMEDETOMIDINE HCL IN NACL 400 MCG/100ML IV SOLN
0.4000 ug/kg/h | INTRAVENOUS | Status: DC
  Administered 2019-02-08 – 2019-02-09 (×5): 0.7 ug/kg/h via INTRAVENOUS
  Filled 2019-02-08 (×6): qty 100

## 2019-02-08 MED ORDER — PHENTOLAMINE MESYLATE 5 MG IJ SOLR
5.0000 mg | Freq: Once | INTRAMUSCULAR | Status: AC
  Administered 2019-02-08: 09:00:00 5 mg via SUBCUTANEOUS
  Filled 2019-02-08: qty 5

## 2019-02-08 MED ORDER — SODIUM CHLORIDE 0.9 % IV SOLN: 250.0000 mL | INTRAVENOUS | Status: DC

## 2019-02-08 MED ORDER — LACTATED RINGERS IV BOLUS
2000.0000 mL | Freq: Once | INTRAVENOUS | Status: AC
  Administered 2019-02-08: 12:00:00 1000 mL via INTRAVENOUS

## 2019-02-08 MED ORDER — LACTATED RINGERS IV SOLN
INTRAVENOUS | Status: DC
  Administered 2019-02-08: 08:00:00 via INTRAVENOUS

## 2019-02-08 MED ORDER — PANTOPRAZOLE SODIUM 40 MG IV SOLR
40.0000 mg | Freq: Two times a day (BID) | INTRAVENOUS | Status: DC
  Administered 2019-02-08 – 2019-02-09 (×4): 40 mg via INTRAVENOUS
  Filled 2019-02-08 (×4): qty 40

## 2019-02-08 MED ORDER — HYDROCORTISONE NA SUCCINATE PF 100 MG IJ SOLR
50.0000 mg | Freq: Four times a day (QID) | INTRAMUSCULAR | Status: DC
  Administered 2019-02-08 – 2019-02-09 (×5): 50 mg via INTRAVENOUS
  Filled 2019-02-08 (×5): qty 2

## 2019-02-08 MED ORDER — MIDAZOLAM HCL 2 MG/2ML IJ SOLN
INTRAMUSCULAR | Status: AC
  Administered 2019-02-08: 10:00:00 2 mg
  Filled 2019-02-08: qty 2

## 2019-02-08 MED ORDER — DEXTROSE 50 % IV SOLN
INTRAVENOUS | Status: AC
  Administered 2019-02-08: 50 mL
  Filled 2019-02-08: qty 50

## 2019-02-08 MED ORDER — STERILE WATER FOR INJECTION IV SOLN
INTRAVENOUS | Status: DC
  Administered 2019-02-08 – 2019-02-09 (×3): via INTRAVENOUS
  Filled 2019-02-08 (×4): qty 850

## 2019-02-08 MED ORDER — PHENYLEPHRINE HCL-NACL 10-0.9 MG/250ML-% IV SOLN
25.0000 ug/min | INTRAVENOUS | Status: DC
  Administered 2019-02-08: 03:00:00 25 ug/min via INTRAVENOUS
  Filled 2019-02-08 (×9): qty 250

## 2019-02-08 MED ORDER — METOCLOPRAMIDE HCL 5 MG/ML IJ SOLN
5.0000 mg | Freq: Once | INTRAMUSCULAR | Status: AC
  Administered 2019-02-08: 5 mg via INTRAVENOUS
  Filled 2019-02-08: qty 2

## 2019-02-08 MED ORDER — SODIUM BICARBONATE 8.4 % IV SOLN
100.0000 meq | Freq: Once | INTRAVENOUS | Status: AC
  Administered 2019-02-08: 23:00:00 100 meq via INTRAVENOUS

## 2019-02-08 MED ORDER — NOREPINEPHRINE 4 MG/250ML-% IV SOLN
0.0000 ug/min | INTRAVENOUS | Status: DC
  Administered 2019-02-08: 11:00:00 40 ug/min via INTRAVENOUS
  Filled 2019-02-08 (×2): qty 250

## 2019-02-08 MED ORDER — LACTULOSE ENEMA
300.0000 mL | Freq: Three times a day (TID) | ORAL | Status: DC
  Administered 2019-02-08 (×3): 300 mL via RECTAL
  Filled 2019-02-08 (×6): qty 300

## 2019-02-08 MED ORDER — DEXTROSE 10 % IV SOLN
INTRAVENOUS | Status: DC
  Administered 2019-02-08 – 2019-02-09 (×4): via INTRAVENOUS

## 2019-02-08 MED ORDER — FENTANYL 2500MCG IN NS 250ML (10MCG/ML) PREMIX INFUSION
0.0000 ug/h | INTRAVENOUS | Status: DC
  Administered 2019-02-08: 12:00:00 100 ug/h via INTRAVENOUS
  Administered 2019-02-09: 04:00:00 150 ug/h via INTRAVENOUS
  Filled 2019-02-08 (×3): qty 250

## 2019-02-08 NOTE — ED Notes (Signed)
The pt placed back on the monitor where he had pulled off  Pt straightened in the bed an d  Sheet hanged

## 2019-02-08 NOTE — ED Notes (Signed)
Pt arrived from [employer] ed with a perforated bowel  Pr alert answering questions  2 ivs foley cath  neosyn drip still.. rt great toe missing  Pedal pulses bil-laterally

## 2019-02-08 NOTE — ED Notes (Signed)
Upon assessment, phenylephrine going at 200 mcg.

## 2019-02-08 NOTE — ED Provider Notes (Signed)
I assumed care of this patient from Dr. Tomi Bamberger.  Please see their note for further details of Hx, PE.  Briefly patient is a 63 y.o. male who presented N/V, and abd pain. Noted to have perforated viscus, likely peptic ulcer. Was hypotensive requiring IVF and pressors.   Assessed by surgery at [facility] who recommended transfer.  Remained stable in route while on pressors.  Here pressors continued. Surgery consulted who evaluated the patient in the ED and did not feel patient required emergent surgery. Recommended medical ICU admission. CC consulted for admission.  .Critical Care Performed by: Fatima Blank, MD Authorized by: Fatima Blank, MD      CRITICAL CARE Performed by: Grayce Sessions Cardama Total critical care time: 30 minutes Critical care time was exclusive of separately billable procedures and treating other patients. Critical care was necessary to treat or prevent imminent or life-threatening deterioration. Critical care was time spent personally by me on the following activities: development of treatment plan with patient and/or surrogate as well as nursing, discussions with consultants, evaluation of patient's response to treatment, examination of patient, obtaining history from patient or surrogate, ordering and performing treatments and interventions, ordering and review of laboratory studies, ordering and review of radiographic studies, pulse oximetry and re-evaluation of patient's condition.     Fatima Blank, MD 02/08/19 [phone]

## 2019-02-08 NOTE — Procedures (Signed)
LIJ MML insertion Emergent consent Using [place] and usual sterile seldinger technique, LIJ cannulated with 20cm line and sutures in place, sterile dressing applied No immediate complications, all ports draw and flush CXR pending

## 2019-02-08 NOTE — ED Notes (Signed)
Pt IV site swollen. IV removed and pharmacist called regarding infiltration. Orders to apply heat.

## 2019-02-08 NOTE — Progress Notes (Signed)
CCM Update note  - Arrived to unit agonal breathing with MAPs in 34s, sats in 107s - Called son, wants everything done for now to see if we can pull him through this - Was walking, talking, ECOG 0 prior to this, no known  - Will give benefit of doubt and try to pull him through this - Discussed code status, they are not ready for DNR - Add vasopressin, levophed, stress steroids, bicarb drip - Abx, NGT to suction, PPI - Grim prognosis, updated sons at length - Awaiting COVID swab  An additional 40 minutes CCM time spent not including any separately billable procedures  Erskine Emery MD

## 2019-02-08 NOTE — ED Notes (Signed)
Called [facility] Davies for surgery trans. 01:10

## 2019-02-08 NOTE — ED Provider Notes (Addendum)
Patient presented with hypotension and right lower abdominal pain.  His blood pressure improved with IV fluid resuscitation.  Patient however does remain tachycardic.  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  He has a history of duodenal ulcer and it is presumed that his where his perforation has originated.  He has been seen by surgery.  They feel like he cannot be handled at this facility.  We attempted transfer to [facility] but they also do not have any beds.  We attempted to transfer him to [facility] and [place] [facility] however they do not have any beds.  Patient was discussed with Dr. Leonette Monarch, attending at [facility], ED at 1:34 AM he has accepted in transfer to their ED.  I also spoke to [place], the charge nurse at [facility] at 1:34 AM and she thinks that they were told there would be an ICU bed available soon.  When I saw the patient he is still complaining of some right-sided abdominal pain but states that it is improving.  He was still having a lot of dry heaves.  He did not have an acute abdomen.  On his lab work he has a very elevated lactic acid at 9.7, his urine looks like he has a UTI, he has a new acute renal insufficiency suggestive of acute renal injury, his anion gap is greater than 20 with a bicarb of 19, his INR is only mildly elevated at 1.4  Oh 2:00 AM nurses noted patient seemed to get more short of breath after they passed the NG tube.  His pulse ox dropped to 86 and then came back up to 92%.  Patient appeared to be tachypneic.  When I listen to him he had some rales.  He denies being short of breath.  He was placed on a nonrebreather.  His blood pressure was checked and was 89/63.  He had had a portable chest x-ray done after the NG was inserted, when I look at it I think he has some increased pulmonary vascularity.  He was given IV Lasix and started on peripheral IV Neo-Synephrine.  2:50 AM patient is on EMS stretcher.  His  pulse ox is 91 to 94%, he was given albuterol inhaler 8 puffs.  His blood pressure on the Neo-Synephrine drip is now 118/71.  Medical screening examination/treatment/procedure(s) were conducted as a shared visit with non-physician practitioner(s) and myself.  I personally evaluated the patient during the encounter.  EKG Interpretation  Date/Time:  Feb 09, 2019 EST Ventricular Rate:  112 PR Interval:    QRS Duration: 91 QT Interval:  363 QTC Calculation: 496 R Axis:   81 Text Interpretation: Sinus tachycardia Borderline right axis deviation Borderline T wave abnormalities Borderline prolonged QT interval Confirmed by Elnora Morrison [phone]) on 02/10/2019 11:09:29 PM   CRITICAL CARE Performed by: Teirra Carapia L Kaleeya Hancock Total critical care time: 31 minutes Critical care time was exclusive of separately billable procedures and treating other patients. Critical care was necessary to treat or prevent imminent or life-threatening deterioration. Critical care was time spent personally by me on the following activities: development of treatment plan with patient and/or surrogate as well as nursing, discussions with consultants, evaluation of patient's response to treatment, examination of patient, obtaining history from patient or surrogate, ordering and performing treatments and interventions, ordering and review of laboratory studies, ordering and review of radiographic studies, pulse oximetry and re-evaluation of patient's condition.  Dwayne Albe, MD 02/08/19 Shelby Dubin    Dwayne Albe, MD 02/08/19 [phone]

## 2019-02-08 NOTE — Progress Notes (Signed)
  Echocardiogram 2D Echocardiogram has been performed.  Dwayne Davies 02/08/2019, 3:58 PM

## 2019-02-08 NOTE — ED Notes (Signed)
Pharmacy ordered phentolamine to be given for infiltration. MD made aware of IV site infiltration.

## 2019-02-08 NOTE — Progress Notes (Signed)
Spoke with Lorenz Coaster RN to confirm no UOP at this time, CVP & LA to trend as ordered.

## 2019-02-08 NOTE — ED Notes (Signed)
Dr. Grandville Silos paged to RN per her request

## 2019-02-08 NOTE — ED Notes (Signed)
CC paged to Kimberly regarding patients bed request

## 2019-02-08 NOTE — Progress Notes (Signed)
eLink Physician-Brief Progress Note Patient Name: Dwayne Davies. DOB: 1955-07-29 MRN: [id number]   Date of Service  02/08/2019  HPI/Events of Note  ABG on 90%/PRVC 28/TV 650/P 12 = 7.142/25.5/90.0. His pCO2 is already = 25.5, therefore, little room to increase ventilation. Currently on a NaHCO3 IV infusion at 125 mL/hour.  eICU Interventions  Will order: 1. NaHCO3 100 meq IV now. 2. ABG @ 12 midnight.      Intervention Category Major Interventions: Acid-Base disturbance - evaluation and management;Respiratory failure - evaluation and management  Kahdijah Errickson Eugene 02/08/2019, 10:02 PM

## 2019-02-08 NOTE — Progress Notes (Signed)
CRITICAL VALUE ALERT  Critical Value:  PH 7.142, PaCO2 25.5, PaO2 90, HCO3 8.7  Date & Time Notied:  02/08/2019 2145  Provider Notified: Dr. Emmit Alexanders via [place] RN Leveda Anna  Orders Received/Actions taken: awaiting new order

## 2019-02-08 NOTE — ED Notes (Signed)
Date and time results received: 02/08/19 0138   Test: Lactic Critical Value: 8.3  Name of Provider Notified: Tomi Bamberger. Iva MD

## 2019-02-08 NOTE — ED Notes (Signed)
\  critial care here to see the pt

## 2019-02-08 NOTE — ED Notes (Signed)
THE PHARMAIST GREG SUGGESTS LOWERING THE DRIP TO 200MG /MIN  DONE

## 2019-02-08 NOTE — Progress Notes (Signed)
Elevated HR.  Will attempt echo once HR normalizes. 

## 2019-02-08 NOTE — ED Notes (Signed)
Pt has pulled off his pulse ox for the 3rd time  aND HE ONTINUES TO PULL AT HIS FOLEY  HIS LT HAND IV HAS BEEN BANDAGED WHERE HE HAS BEEN PULLING ON THE TAPE

## 2019-02-08 NOTE — ED Notes (Signed)
[facility] for surgery

## 2019-02-08 NOTE — Progress Notes (Signed)
Spoke with RN about the medication needed for the patient. RN in the ED can administer medication needed for the patient and IV team does not need to be present

## 2019-02-08 NOTE — ED Notes (Signed)
Pt back from CT

## 2019-02-08 NOTE — Consult Note (Addendum)
Reason for Consult:free air Referring Physician: Drema Pry  Dwayne Davies. is an 63 y.o. male.  HPI: 63 year old male with a history of peptic ulcer disease, COPD, diabetes, hypertension, and previous heavy alcohol abuse presented to [facility] overnight with a several day history of abdominal pain.  Work-up included CT scan of the abdomen and pelvis which showed scattered dots of free intraperitoneal air consistent with a perforated viscus.  This was thought to likely be a perforated ulcer due to his history.  Unfortunately, his CT scan also demonstrated advanced cirrhosis with visible splenomegaly and portal venous shunting as well as ascites.  I was initially contacted by the [facility] emergency department after he was seen in consultation by Dr. Franky Macho from general surgery.  Their plan was transfer to [facility] and admission by CCM but there were no ICU beds.  He was then accepted in transfer by the emergency department physician here and has arrived.  He reports a several day history of abdominal pain, no nausea or vomiting.  Denies changes in bowel habits.  He reports he is unaware that he has cirrhosis.  He gives additional history of previous appendectomy, some type of hernia, and cannot remember what his lower midline abdominal scar is from.  He lives with his brother.  Past Medical History:  Diagnosis Date  . Anxiety   . Charcot-Marie-Tooth disease   . Chest pain   . Chronic back pain   . Chronic pain in left shoulder   . COPD (chronic obstructive pulmonary disease) (HCC)   . Diabetes mellitus   . Elevated lipids   . GERD (gastroesophageal reflux disease)   . Gout   . Gout   . History of kidney stones   . Hypertension   . Normal cardiac stress test 2000, 2005  . Shortness of breath     Past Surgical History:  Procedure Laterality Date  . CARDIAC CATHETERIZATION     x2 (last 2001), both normal coronary arteries  . CATARACT EXTRACTION W/PHACO  04/08/2012    Procedure: CATARACT EXTRACTION PHACO AND INTRAOCULAR LENS PLACEMENT (IOC);  Surgeon: Susa Simmonds, MD;  Location: [facility] OR[facility];  Service: Ophthalmology;  Laterality: Right;  CDE:40.75  . CATARACT EXTRACTION W/PHACO Left 09/25/2016   Procedure: CATARACT EXTRACTION PHACO AND INTRAOCULAR LENS PLACEMENT (IOC);  Surgeon: Gemma Payor, MD;  Location: [facility];  Service: Ophthalmology;  Laterality: Left;  CDE:  19.49  . ESOPHAGOGASTRODUODENOSCOPY (EGD) WITH PROPOFOL N/A 11/05/2015   Procedure: ESOPHAGOGASTRODUODENOSCOPY (EGD) WITH PROPOFOL;  Surgeon: Malissa Hippo, MD;  Location: [facility] ENDO SUITE;  Service: Endoscopy;  Laterality: N/A;  . ESOPHAGOGASTRODUODENOSCOPY (EGD) WITH PROPOFOL N/A 11/08/2015   Procedure: ESOPHAGOGASTRODUODENOSCOPY (EGD) WITH PROPOFOL;  Surgeon: Malissa Hippo, MD;  Location: [facility];  Service: Endoscopy;  Laterality: N/A;    Family History  Problem Relation Age of Onset  . CAD Father   . Coronary artery disease Neg Hx     Social History:  reports that he has never smoked. He has never used smokeless tobacco. He reports that he does not drink alcohol or use drugs.  Allergies: No Known Allergies  Medications: I have reviewed the patient's current medications.  Results for orders placed or performed during the hospital encounter of 02-26-19 (from the past 48 hour(s))  CBC with Differential     Status: None   Collection Time: February 26, 2019  9:40 PM  Result Value Ref Range   WBC 6.8 4.0 - 10.5 K/uL   RBC  5.44 4.22 - 5.81 MIL/uL   Hemoglobin 15.4 13.0 - 17.0 g/dL   HCT 16.150.8 09.639.0 - 04.552.0 %   MCV 93.4 80.0 - 100.0 fL   MCH 28.3 26.0 - 34.0 pg   MCHC 30.3 30.0 - 36.0 g/dL   RDW 40.913.5 81.111.5 - 91.415.5 %   Platelets 300 150 - 400 K/uL   nRBC 0.0 0.0 - 0.2 %   Neutrophils Relative % 76 %   Neutro Abs 5.0 1.7 - 7.7 K/uL   Lymphocytes Relative 11 %   Lymphs Abs 0.7 0.7 - 4.0 K/uL   Monocytes Relative 12 %   Monocytes Absolute 0.8 0.1 - 1.0 K/uL   Eosinophils Relative 0 %   Eosinophils  Absolute 0.0 0.0 - 0.5 K/uL   Basophils Relative 0 %   Basophils Absolute 0.0 0.0 - 0.1 K/uL   Immature Granulocytes 1 %   Abs Immature Granulocytes 0.06 0.00 - 0.07 K/uL    Comment: Performed at [facility], [address] [place], [place] [address]  Comprehensive metabolic panel     Status: Abnormal   Collection Time: 03/03/2019  9:40 PM  Result Value Ref Range   Sodium 127 (L) 135 - 145 mmol/L   Potassium 3.6 3.5 - 5.1 mmol/L   Chloride 87 (L) 98 - 111 mmol/L   CO2 19 (L) 22 - 32 mmol/L   Glucose, Bld 434 (H) 70 - 99 mg/dL   BUN 23 8 - 23 mg/dL   Creatinine, Ser 6.212.11 (H) 0.61 - 1.24 mg/dL   Calcium 9.3 8.9 - 30.810.3 mg/dL   Total Protein 6.9 6.5 - 8.1 g/dL   Albumin 2.6 (L) 3.5 - 5.0 g/dL   AST 28 15 - 41 U/L   ALT <5 0 - 44 U/L   Alkaline Phosphatase 46 38 - 126 U/L   Total Bilirubin 2.8 (H) 0.3 - 1.2 mg/dL   GFR calc non Af Amer 33 (L) >60 mL/min   GFR calc Af Amer 38 (L) >60 mL/min   Anion gap >20 (H) 5 - 15    Comment: Performed at [facility], [address] [place], [place] [address]  APTT     Status: Abnormal   Collection Time: 02/05/2019  9:40 PM  Result Value Ref Range   aPTT 38 (H) 24 - 36 seconds    Comment:        IF BASELINE aPTT IS ELEVATED, SUGGEST PATIENT RISK ASSESSMENT BE USED TO DETERMINE APPROPRIATE ANTICOAGULANT THERAPY. Performed at [facility], [address] [place], [place] [address]   Protime-INR     Status: Abnormal   Collection Time: 02/17/2019  9:40 PM  Result Value Ref Range   Prothrombin Time 16.7 (H) 11.4 - 15.2 seconds   INR 1.4 (H) 0.8 - 1.2    Comment: (NOTE) INR goal varies based on device and disease states. Performed at [facility], [address] [place], [place] [address]   CBG monitoring, ED     Status: Abnormal   Collection Time: 02/15/2019  9:47 PM  Result Value Ref Range   Glucose-Capillary 434 (H) 70 - 99 mg/dL  Lactic acid, plasma     Status: Abnormal   Collection Time: 02/13/2019  9:56 PM  Result Value Ref Range   Lactic  Acid, Venous 9.7 (HH) 0.5 - 1.9 mmol/L    Comment: CRITICAL RESULT CALLED TO, READ BACK BY AND VERIFIED WITH: WALL,E ON 02/02/2019 AT 2240 BY LOY,C Performed at [facility], [address] [place] [address]   Urinalysis, Routine w reflex microscopic     Status: Abnormal   Collection Time: Feb 26, 2019 11:08 PM  Result Value Ref Range   Color, Urine AMBER (A) YELLOW    Comment: BIOCHEMICALS MAY BE AFFECTED BY COLOR   APPearance TURBID (A) CLEAR   Specific Gravity, Urine 1.027 1.005 - 1.030   pH 5.0 5.0 - 8.0   Glucose, UA 50 (A) NEGATIVE mg/dL   Hgb urine dipstick LARGE (A) NEGATIVE   Bilirubin Urine NEGATIVE NEGATIVE   Ketones, ur NEGATIVE NEGATIVE mg/dL   Protein, ur 644 (A) NEGATIVE mg/dL   Nitrite NEGATIVE NEGATIVE   Leukocytes,Ua NEGATIVE NEGATIVE   RBC / HPF >50 (H) 0 - 5 RBC/hpf   WBC, UA 21-50 0 - 5 WBC/hpf   Bacteria, UA RARE (A) NONE SEEN   WBC Clumps PRESENT    Mucus PRESENT    Hyaline Casts, UA PRESENT    Non Squamous Epithelial 0-5 (A) NONE SEEN    Comment: Performed at [facility], [address], [place], [place] [address]  Lactic acid, plasma     Status: Abnormal   Collection Time: 02/08/19 12:19 AM  Result Value Ref Range   Lactic Acid, Venous 8.3 (HH) 0.5 - 1.9 mmol/L    Comment: CRITICAL RESULT CALLED TO, READ BACK BY AND VERIFIED WITH: SAPPLET,J @ 0138 ON 02/08/19 BY JUW Performed at [facility], [address], [place], [place] [address]   Type and screen [facility]     Status: None   Collection Time: 02/08/19 12:59 AM  Result Value Ref Range   ABO/RH(D) A POS    Antibody Screen NEG    Sample Expiration      02/11/2019,2359 Performed at [facility], [address], [place], [place] [address]     Ct Abdomen Pelvis Wo Contrast  Result Date: 02/08/2019 CLINICAL DATA:  Abdominal pain. Right lower quadrant abdominal pain. EXAM: CT ABDOMEN AND PELVIS WITHOUT CONTRAST TECHNIQUE: Multidetector CT imaging of the abdomen and pelvis was performed  following the standard protocol without IV contrast. COMPARISON:  November 04, 2015 FINDINGS: Lower chest: The lung bases are clear.The heart size is significantly enlarged. There is atelectasis at the lung bases. Hepatobiliary: The liver demonstrates a nodular contour. There is cholelithiasis. Gallbladder wall thickening is noted.There is no biliary ductal dilation. Pancreas: Normal contours without ductal dilatation. No peripancreatic fluid collection. Spleen: The spleen is enlarged measuring 14 cm craniocaudad. Adrenals/Urinary Tract: --Adrenal glands: No adrenal hemorrhage. --Right kidney/ureter: Multiple nonobstructing stones are noted. --Left kidney/ureter: Multiple nonobstructing stones are noted. --Urinary bladder: There is bladder wall thickening which is favored to be secondary to underdistention. There are few pockets of gas within the urinary bladder lumen. Stomach/Bowel: --Stomach/Duodenum: There is diffuse wall thickening of the distal esophagus with a moderate-sized hiatal hernia. There is some mild fat stranding wall thickening of the proximal duodenum. --Small bowel: There is no small bowel obstruction. There is some wall thickening of multiple loops of jejunum in the mid left abdomen. --Colon: Rectosigmoid diverticulosis without acute inflammation. There is some wall thickening of the cecum. --Appendix: The appendix is not reliably identified. There is a surgical clip in the right lower quadrant which may indicate the patient has undergone prior cholecystectomy. Vascular/Lymphatic: Atherosclerotic calcification is present within the non-aneurysmal abdominal aorta, without hemodynamically significant stenosis. There is portosystemic shunting. --there are few prominent retroperitoneal lymph nodes. --there are borderline enlarged mesenteric lymph nodes. --No pelvic or inguinal lymphadenopathy. Reproductive: The prostate gland is  enlarged. Other: There is a small volume of pneumoperitoneum. There is a  moderate to large volume of abdominal ascites. There is some nodularity of the omentum. Musculoskeletal. No acute displaced fractures. IMPRESSION: 1. Small volume pneumoperitoneum consistent with a hollow viscus perforation. The exact location of the perforation cannot be determined on this exam. Of note, the patient's CT from 2017 demonstrated findings concerning for a duodenal ulcer. There is some mild wall thickening of the proximal duodenum, however this is not well evaluated in the absence of IV contrast. 2. Cirrhosis with sequela of portal hypertension including splenomegaly, moderate to large volume of abdominal ascites, and portosystemic shunting. 3. Bilateral nonobstructing nephrolithiasis. 4. There is cholelithiasis without secondary signs of acute cholecystitis. 5. Nodularity of the omentum favored to be secondary to the presence of ascites. Follow-up is recommended. Consider fluid sampling of the ascites to help rule out a process such is omental carcinomatosis. 6. Cardiomegaly. 7. Diffuse wall thickening of the distal esophagus may be secondary to esophagitis. A moderate-sized hiatal hernia is noted. 8. Additional chronic findings as above. These results were called by telephone at the time of interpretation on 02/08/2019 at 12:14 am to provider [facility] , who verbally acknowledged these results. Electronically Signed   By: Constance Holster M.D.   On: 02/08/2019 00:16   Dg Chest Portable 1 View  Result Date: 02/08/2019 CLINICAL DATA:  Initial evaluation for NG tube placement. EXAM: PORTABLE CHEST 1 VIEW COMPARISON:  Prior radiograph from 24-Feb-2019. FINDINGS: Patient is markedly rotated to the left. And enteric tube has been placed and is seen coursing into the upper abdomen, nonvisualization of the tip. Grossly stable cardiomegaly. Mediastinal silhouette within normal limits. Aortic atherosclerosis. Known hiatal hernia better seen on recent CT. Lungs hypoinflated. Mild diffuse pulmonary  vascular and interstitial congestion without frank pulmonary edema. Left worse than right bibasilar airspace disease, which could reflect consolidation and/or atelectasis or effusion, similar to previous. No pneumothorax. Free air seen subjacent to the right hemidiaphragm, consistent with previously identified pneumoperitoneum. Osseous structures unchanged. IMPRESSION: 1. Enteric tube courses into the abdomen, with nonvisualization of the tip. 2. Pneumoperitoneum, better characterized on prior CT. 3. Stable cardiomegaly with mild diffuse pulmonary vascular congestion without frank pulmonary edema. 4. Left worse than right bibasilar airspace disease, which could reflect atelectasis and/or infiltrates or effusion, similar to previous. Electronically Signed   By: Jeannine Boga M.D.   On: 02/08/2019 02:21   Dg Chest Portable 1 View  Result Date: 2019-02-24 CLINICAL DATA:  Hypoxia, tachycardia EXAM: PORTABLE CHEST 1 VIEW COMPARISON:  March 08, 2010 FINDINGS: There is moderate cardiomegaly. Aortic knob calcifications. Pulmonary vascular congestion is seen in the perihilar regions. There is a probable trace right and small left pleural effusion. Hazy airspace opacity seen at both lung bases. No acute osseous abnormality. IMPRESSION: Cardiomegaly and pulmonary vascular congestion. Trace right and small left pleural effusion. Hazy airspace opacity at both lung bases which could be layering effusion, early infectious etiology and/or atelectasis. Electronically Signed   By: Prudencio Pair M.D.   On: February 24, 2019 23:08    Review of Systems  Constitutional: Positive for malaise/fatigue.  HENT: Negative.   Eyes: Negative.   Respiratory: Positive for cough and shortness of breath.   Cardiovascular: Negative for chest pain.  Gastrointestinal: Positive for abdominal pain and nausea. Negative for constipation and vomiting.  Genitourinary: Negative.   Musculoskeletal: Negative.   Skin: Positive for itching.   Neurological: Negative for loss of consciousness.  Endo/Heme/Allergies: Bruises/bleeds easily.  Psychiatric/Behavioral:  Positive for substance abuse.   Blood pressure (!) 127/112, pulse (!) 117, temperature 98.3 F (36.8 C), temperature source Oral, resp. rate (!) 24, height  (1.651 m), weight 99.3 kg, SpO2 97 %. Physical Exam  Constitutional: He appears well-developed. He appears distressed.  HENT:  Head: Normocephalic.  Right Ear: External ear normal.  Left Ear: External ear normal.  Mouth/Throat: Oropharynx is clear and moist.  Eyes: Pupils are equal, round, and reactive to light. Scleral icterus is present.  Neck: Neck supple. No tracheal deviation present.  Cardiovascular:  Tachycardic  Respiratory: He has rales.  Respiratory rate 30s  GI: He exhibits distension. He exhibits no mass. There is abdominal tenderness. There is no rebound and no guarding.  Mild upper abdominal tenderness, right lower quadrant and lower midline scars, ascites, no peritonitis  Musculoskeletal:     Comments: No deformity or tenderness  Neurological: He is alert. He exhibits normal muscle tone.  Skin: Skin is warm.  Psychiatric: He has a normal mood and affect.    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  He does not have peritonitis on exam and the volume of pneumoperitoneum is small and scattered so I do not recommend surgery.  In light of this, I agree with admission by CCM.  Recommend NG tube decompression, IV Protonix twice daily, broad-spectrum antibiotics intravenously, nothing by mouth, and GI consultation for management of his cirrhosis.  I discussed these recommendations and the situation with him in detail.  He expressed understanding and did say he would consider intubation if needed.  We will follow.  Liz Malady 02/08/2019,  4:05 AM

## 2019-02-08 NOTE — Consult Note (Addendum)
NAME:  Dwayne Davies., MRN:  [id number], DOB:  [birth year], LOS: 0 ADMISSION DATE:  02/11/2019, CONSULTATION DATE:  02/08/19 REFERRING MD:  Dr. Lynelle Doctor, CHIEF COMPLAINT:  Abdominal pain  Brief History   63 y.o. M with PMH of COPD, Type 2 DM, GERD, Chronic pain who presented with 2 days of nausea/vomiting, fever and RLQ abdominal pain.  He was hyperglycemic, hypoxic and hypotensive in the ED.  CT abd/pelvis revealed small pneumoperitoneum, cirrhosis, possible omental carcinomatosis.   Surgery was consulted and did not feel that patient was a surgical candidate secondary to high peri-operative mortality risk and low suspicion for  Peritonitis and recommended ED to ED transfer for CCM admission      History of present illness   Dwayne Davies is a 63 y.o. M with  PMH of Type 2 DM, GERD, chronic pain, GERD , Renal stones who had several days   Past Medical History   has a past medical history of Anxiety, Charcot-Marie-Tooth disease, Chest pain, Chronic back pain, Chronic pain in left shoulder, COPD (chronic obstructive pulmonary disease) (HCC), Diabetes mellitus, Elevated lipids, GERD (gastroesophageal reflux disease), Gout, Gout, History of kidney stones, Hypertension, Normal cardiac stress test (2000, 2005), and Shortness of breath.   Significant Hospital Events   11/7 Admit to PCCM  Consults:  PCCM  GI  Surgery  Procedures:    Significant Diagnostic Tests:  11/7 CT abd/pelvis>>Small volume pneumoperitoneum consistent with a hollow viscus Perforation, Cirrhosis with sequela of portal hypertension including Splenomegaly, Consider fluid sampling of theascites to help rule out a process such is omental carcinomatosis  Micro Data:  11/7 Sars-CoV-2>> 11/7 UC>> 11/7 BCx2>>  Antimicrobials:  Cefepime 11/7- Flagyl 11/7-  Interim history/subjective:    Objective   Blood pressure (!) 127/112, pulse (!) 117, temperature 98.3 F (36.8 C), temperature source Oral, resp. rate (!) 24, height   (1.651 m), weight 99.3 kg, SpO2 97 %.        Intake/Output Summary (Last 24 hours) at 02/08/2019 0513 Last data filed at 02/08/2019 0201 Gross per 24 hour  Intake 1600 ml  Output 300 ml  Net 1300 ml   Filed Weights   Feb 11, 2019 2114  Weight: 99.3 kg   General:  Chronically ill-appearing M resting on venti mask without acute distress HEENT: MM pink/moist Neuro: oriented to person, disoriented to situation, answers some questions, moving all extremities and no focal deficits CV: s1s2 rrr, no m/r/g PULM:  Lungs with diminished air movement bilaterally, no rhonchi or wheezing GI: soft, distended, diffusely TTP, no peritoneal signs  bsx4 active  Extremities: warm/dry, no edema  Skin: no rashes or lesions   Resolved Hospital Problem list     Assessment & Plan:   Septic Shock secondary to pneumoperitoneum from possible perforated duodenal ulcer -Surgery consulted and in the setting of small volume pneumoperitoneum and 82% perioperative mortality surgery not recommended P: -NG tube placed, continue decompression -Protonix -Broad spectrum antibiotics, Cefepime Flagyl -Received 4L NS in the ED now on peripheral dose Neosynephrine, given severe lactic acidosis will continue LR @ 75cc/hr  -Significant Lactic acidosis improved slightly from 9.7 to 8.3, continue to trend  New Liver Cirrhosis with omental nodularity and large volume ascites -Received Lasix   -No history of significant ETOH abuse per his son -LFT's WNL, bili 2.8 P: -GI consult, doubt paracentesis would be helpful at this point  -check Ammonia level -Acute hepatitis panel -Check Ammonia level   Anion Gap Metabolic Acidosis  -Glucose elevated on arrival >400,  no ketonuria, possibly secondary to lactic acidosis with mild DKA, no insulin in the ED  P: -Repeat Stat metabolic panel, may need insulin gtt -Obtain ABG   Acute Kidney Injury -likely secondary to sepsis/volume depletion, also on Metformin P:  -follow BMP after fluid resuscition   Acute Hypoxic Respiratory Failure  -Stable on non-rebreather in the ED P: -Transition to HFNC, at risk for intubation, spoke with son who is not aware of any advanced directives, pt full code for now    Type 2 DM  -hyperglycemic with AGMA P: -SSI, f/u BMP   Best practice:  Diet: NPO Pain/Anxiety/Delirium protocol (if indicated): n/a VAP protocol (if indicated): n/a DVT prophylaxis: hold for now, INR 1.4, SCD's GI prophylaxis: protonix Glucose control: SSI Mobility: bedrest Code Status: full code Family Communication: Spoke with pt's son Mardelle Mattendy who will be visiting today Disposition: ICU  Labs   CBC: Recent Labs  Lab Sep 05, 2018 2140  WBC 6.8  NEUTROABS 5.0  HGB 15.4  HCT 50.8  MCV 93.4  PLT 300    Basic Metabolic Panel: Recent Labs  Lab Sep 05, 2018 2140  NA 127*  K 3.6  CL 87*  CO2 19*  GLUCOSE 434*  BUN 23  CREATININE 2.11*  CALCIUM 9.3   GFR: Estimated Creatinine Clearance: 39.3 mL/min (A) (by C-G formula based on SCr of 2.11 mg/dL (H)). Recent Labs  Lab Sep 05, 2018 2140 Sep 05, 2018 2156 02/08/19 0019  WBC 6.8  --   --   LATICACIDVEN  --  9.7* 8.3*    Liver Function Tests: Recent Labs  Lab Sep 05, 2018 2140  AST 28  ALT <5  ALKPHOS 46  BILITOT 2.8*  PROT 6.9  ALBUMIN 2.6*   No results for input(s): LIPASE, AMYLASE in the last 168 hours. No results for input(s): AMMONIA in the last 168 hours.  ABG    Component Value Date/Time   TCO2 28 11/04/2015 0227     Coagulation Profile: Recent Labs  Lab Sep 05, 2018 2140  INR 1.4*    Cardiac Enzymes: No results for input(s): CKTOTAL, CKMB, CKMBINDEX, TROPONINI in the last 168 hours.  HbA1C: Hgb A1c MFr Bld  Date/Time Value Ref Range Status  06/04/2015 07:34 AM 8.7 (H) <5.7 % Final    Comment:                                                                           According to the ADA Clinical Practice Recommendations for 2011, when HbA1c is used as a screening  test:     >=6.5%   Diagnostic of Diabetes Mellitus            (if abnormal result is confirmed)   5.7-6.4%   Increased risk of developing Diabetes Mellitus   References:Diagnosis and Classification of Diabetes Mellitus,Diabetes Care,2011,34(Suppl 1):S62-S69 and Standards of Medical Care in         Diabetes - 2011,Diabetes Care,2011,34 (Suppl 1):S11-S61.     03/18/2015 07:22 AM 9.8 (H) <5.7 % Final    Comment:  According to the ADA Clinical Practice Recommendations for 2011, when HbA1c is used as a screening test:     >=6.5%   Diagnostic of Diabetes Mellitus            (if abnormal result is confirmed)   5.7-6.4%   Increased risk of developing Diabetes Mellitus   References:Diagnosis and Classification of Diabetes Mellitus,Diabetes Care,2011,34(Suppl 1):S62-S69 and Standards of Medical Care in         Diabetes - 2011,Diabetes Care,2011,34 (Suppl 1):S11-S61.       CBG: Recent Labs  Lab 02/06/2019 2147  GLUCAP 434*    Review of Systems:   Unable to obtain due to AMS  Past Medical History  He,  has a past medical history of Anxiety, Charcot-Marie-Tooth disease, Chest pain, Chronic back pain, Chronic pain in left shoulder, COPD (chronic obstructive pulmonary disease) (HCC), Diabetes mellitus, Elevated lipids, GERD (gastroesophageal reflux disease), Gout, Gout, History of kidney stones, Hypertension, Normal cardiac stress test (2000, 2005), and Shortness of breath.   Surgical History    Past Surgical History:  Procedure Laterality Date  . CARDIAC CATHETERIZATION     x2 (last 2001), both normal coronary arteries  . CATARACT EXTRACTION W/PHACO  04/08/2012   Procedure: CATARACT EXTRACTION PHACO AND INTRAOCULAR LENS PLACEMENT (IOC);  Surgeon: Susa Simmonds, MD;  Location: [facility];  Service: Ophthalmology;  Laterality: Right;  CDE:40.75  . CATARACT EXTRACTION W/PHACO Left 09/25/2016   Procedure: CATARACT  EXTRACTION PHACO AND INTRAOCULAR LENS PLACEMENT (IOC);  Surgeon: Gemma Payor, MD;  Location: [facility];  Service: Ophthalmology;  Laterality: Left;  CDE:  19.49  . ESOPHAGOGASTRODUODENOSCOPY (EGD) WITH PROPOFOL N/A 11/05/2015   Procedure: ESOPHAGOGASTRODUODENOSCOPY (EGD) WITH PROPOFOL;  Surgeon: Malissa Hippo, MD;  [facility]: [facility];  Service: Endoscopy;  Laterality: N/A;  . ESOPHAGOGASTRODUODENOSCOPY (EGD) WITH PROPOFOL N/A 11/08/2015   Procedure: ESOPHAGOGASTRODUODENOSCOPY (EGD) WITH PROPOFOL;  Surgeon: Malissa Hippo, MD;  [facility];  Service: Endoscopy;  Laterality: N/A;     Social History   reports that he has never smoked. He has never used smokeless tobacco. He reports that he does not drink alcohol or use drugs.   Family History   His family history includes CAD in his father. There is no history of Coronary artery disease.   Allergies No Known Allergies   Home Medications  Prior to Admission medications   Medication Sig Start Date End Date Taking? Authorizing Provider  albuterol (PROVENTIL HFA;VENTOLIN HFA) 108 (90 BASE) MCG/ACT inhaler Inhale 2 puffs into the lungs every 6 (six) hours as needed for wheezing or shortness of breath. Sho   Yes [provider]  atorvastatin (LIPITOR) 10 MG tablet Take 10 mg by mouth at bedtime. 08/12/18  Yes [provider]  colchicine 0.6 MG tablet Take 0.6 mg by mouth 2 (two) times daily as needed (gout).    Yes [provider]  furosemide (LASIX) 40 MG tablet Take 40 mg by mouth daily.    Yes [provider]  lisinopril (ZESTRIL) 20 MG tablet Take 20 mg by mouth daily. 08/12/18  Yes [provider]  Magnesium Oxide 500 MG CAPS Take 500 mg by mouth daily.   Yes [provider]  Meclizine HCl 25 MG CHEW Chew 25-50 mg by mouth daily as needed (dizziness).   Yes [provider]  metFORMIN (GLUCOPHAGE) 1000 MG tablet Take 1 tablet (1,000 mg total) by mouth 2 (two) times daily  with a meal. 06/14/15  Yes Nida, Denman George,  MD  Omega-3 Fatty Acids (FISH OIL PO) Take 1 capsule by mouth daily.   Yes [provider]  tamsulosin (FLOMAX) 0.4 MG CAPS capsule Take 0.4 mg by mouth daily.    Yes [provider]  VITAMIN E PO Take 1 capsule by mouth daily.   Yes [provider]  Blood Glucose Monitoring Suppl (ACCU-CHEK AVIVA) device Use as instructed 03/10/15   Cassandria Anger, MD  glucose blood (ACCU-CHEK AVIVA) test strip Use to test glucose 4 times a day. 03/10/15   Cassandria Anger, MD  ipratropium-albuterol (DUONEB) 0.5-2.5 (3) MG/3ML SOLN Take 3 mLs by nebulization 4 (four) times daily as needed (shortness of breath.).    [provider]     Critical care time: 45 minutes     CRITICAL CARE Performed by: Otilio Carpen Roselind Klus   Total critical care time: 45 minutes  Critical care time was exclusive of separately billable procedures and treating other patients.  Critical care was necessary to treat or prevent imminent or life-threatening deterioration.  Critical care was time spent personally by me on the following activities: development of treatment plan with patient and/or surrogate as well as nursing, discussions with consultants, evaluation of patient's response to treatment, examination of patient, obtaining history from patient or surrogate, ordering and performing treatments and interventions, ordering and review of laboratory studies, ordering and review of radiographic studies, pulse oximetry and re-evaluation of patient's condition.  Otilio Carpen Aaidyn San, PA-C [place] [facility]  Pager# [phone], if no answer [phone]

## 2019-02-08 NOTE — Consult Note (Signed)
Reason for Consult: Pneumoperitoneum Referring Physician: Dr. Fuller SongKnapp  Dwayne Lyndee Hensenoler Jr. is an 63 y.o. male.  HPI: Patient is a 63 year old white male with a history of COPD, hypertension, and peptic ulcer disease who presents to the emergency room with worsening abdominal pain.  He states the pain is along the right side of his abdomen.  A CT scan of the abdomen was performed which revealed small volume pneumoperitoneum of unknown etiology.  Is suspected the patient may had the perforated duodenal ulcer.  In reviewing the records, he did have a duodenal ulcer that was bleeding and controlled by endoscopy in 2017.  He was also noted to have significant cirrhosis with moderate to severe ascites, splenomegaly, and portosystemic shunting.  Patient is somewhat a poor historian.  He denies any significant alcohol intake.  Past Medical History:  Diagnosis Date  . Anxiety   . Charcot-Marie-Tooth disease   . Chest pain   . Chronic back pain   . Chronic pain in left shoulder   . COPD (chronic obstructive pulmonary disease) (HCC)   . Diabetes mellitus   . Elevated lipids   . GERD (gastroesophageal reflux disease)   . Gout   . Gout   . History of kidney stones   . Hypertension   . Normal cardiac stress test 2000, 2005  . Shortness of breath     Past Surgical History:  Procedure Laterality Date  . CARDIAC CATHETERIZATION     x2 (last 2001), both normal coronary arteries  . CATARACT EXTRACTION W/PHACO  04/08/2012   Procedure: CATARACT EXTRACTION PHACO AND INTRAOCULAR LENS PLACEMENT (IOC);  Surgeon: Susa Simmondsarroll F Haines, MD;  Location: [facility];  Service: Ophthalmology;  Laterality: Right;  CDE:40.75  . CATARACT EXTRACTION W/PHACO Left 09/25/2016   Procedure: CATARACT EXTRACTION PHACO AND INTRAOCULAR LENS PLACEMENT (IOC);  Surgeon: Gemma PayorHunt, Kerry, MD;  Location: [facility];  Service: Ophthalmology;  Laterality: Left;  CDE:  19.49  . ESOPHAGOGASTRODUODENOSCOPY (EGD) WITH PROPOFOL N/A 11/05/2015   Procedure:  ESOPHAGOGASTRODUODENOSCOPY (EGD) WITH PROPOFOL;  Surgeon: Malissa HippoNajeeb U Rehman, MD;  [facility];  Service: Endoscopy;  Laterality: N/A;  . ESOPHAGOGASTRODUODENOSCOPY (EGD) WITH PROPOFOL N/A 11/08/2015   Procedure: ESOPHAGOGASTRODUODENOSCOPY (EGD) WITH PROPOFOL;  Surgeon: Malissa HippoNajeeb U Rehman, MD;  [facility];  Service: Endoscopy;  Laterality: N/A;    Family History  Problem Relation Age of Onset  . CAD Father   . Coronary artery disease Neg Hx     Social History:  reports that he has never smoked. He has never used smokeless tobacco. He reports that he does not drink alcohol or use drugs.  Allergies: No Known Allergies  Medications: Prior to Admission: (Not in a hospital admission)   Results for orders placed or performed during the hospital encounter of 15-Nov-2018 (from the past 48 hour(s))  CBC with Differential     Status: None   Collection Time: 15-Nov-2018  9:40 PM  Result Value Ref Range   WBC 6.8 4.0 - 10.5 K/uL   RBC 5.44 4.22 - 5.81 MIL/uL   Hemoglobin 15.4 13.0 - 17.0 g/dL   HCT 40.950.8 81.139.0 - 91.452.0 %   MCV 93.4 80.0 - 100.0 fL   MCH 28.3 26.0 - 34.0 pg   MCHC 30.3 30.0 - 36.0 g/dL   RDW 78.213.5 95.611.5 - 21.315.5 %   Platelets 300 150 - 400 K/uL   nRBC 0.0 0.0 - 0.2 %   Neutrophils Relative % 76 %   Neutro Abs 5.0 1.7 - 7.7  K/uL   Lymphocytes Relative 11 %   Lymphs Abs 0.7 0.7 - 4.0 K/uL   Monocytes Relative 12 %   Monocytes Absolute 0.8 0.1 - 1.0 K/uL   Eosinophils Relative 0 %   Eosinophils Absolute 0.0 0.0 - 0.5 K/uL   Basophils Relative 0 %   Basophils Absolute 0.0 0.0 - 0.1 K/uL   Immature Granulocytes 1 %   Abs Immature Granulocytes 0.06 0.00 - 0.07 K/uL    Comment: Performed at [facility], [address] [place], [place] [address]  Comprehensive metabolic panel     Status: Abnormal   Collection Time: 02/06/2019  9:40 PM  Result Value Ref Range   Sodium 127 (L) 135 - 145 mmol/L   Potassium 3.6 3.5 - 5.1 mmol/L   Chloride 87 (L) 98 - 111 mmol/L   CO2 19  (L) 22 - 32 mmol/L   Glucose, Bld 434 (H) 70 - 99 mg/dL   BUN 23 8 - 23 mg/dL   Creatinine, Ser 6.96 (H) 0.61 - 1.24 mg/dL   Calcium 9.3 8.9 - 29.5 mg/dL   Total Protein 6.9 6.5 - 8.1 g/dL   Albumin 2.6 (L) 3.5 - 5.0 g/dL   AST 28 15 - 41 U/L   ALT <5 0 - 44 U/L   Alkaline Phosphatase 46 38 - 126 U/L   Total Bilirubin 2.8 (H) 0.3 - 1.2 mg/dL   GFR calc non Af Amer 33 (L) >60 mL/min   GFR calc Af Amer 38 (L) >60 mL/min   Anion gap >20 (H) 5 - 15    Comment: Performed at [facility], [address], [place], [place] [address]  APTT     Status: Abnormal   Collection Time: 02/03/2019  9:40 PM  Result Value Ref Range   aPTT 38 (H) 24 - 36 seconds    Comment:        IF BASELINE aPTT IS ELEVATED, SUGGEST PATIENT RISK ASSESSMENT BE USED TO DETERMINE APPROPRIATE ANTICOAGULANT THERAPY. Performed at [facility], [address], [place], [place] [address]   Protime-INR     Status: Abnormal   Collection Time: 02/16/2019  9:40 PM  Result Value Ref Range   Prothrombin Time 16.7 (H) 11.4 - 15.2 seconds   INR 1.4 (H) 0.8 - 1.2    Comment: (NOTE) INR goal varies based on device and disease states. Performed at [facility], [address] [place], [place] [address]   CBG monitoring, ED     Status: Abnormal   Collection Time: 02/22/2019  9:47 PM  Result Value Ref Range   Glucose-Capillary 434 (H) 70 - 99 mg/dL  Lactic acid, plasma     Status: Abnormal   Collection Time: 02/18/2019  9:56 PM  Result Value Ref Range   Lactic Acid, Venous 9.7 (HH) 0.5 - 1.9 mmol/L    Comment: CRITICAL RESULT CALLED TO, READ BACK BY AND VERIFIED WITH: WALL,E ON 02/08/2019 AT 2240 BY LOY,C Performed at [facility], [address] [place], [place] [address]   Urinalysis, Routine w reflex microscopic     Status: Abnormal   Collection Time: 02/19/2019 11:08 PM  Result Value Ref Range   Color, Urine AMBER (A) YELLOW    Comment: BIOCHEMICALS MAY BE AFFECTED BY COLOR   APPearance TURBID (A) CLEAR   Specific Gravity, Urine  1.027 1.005 - 1.030   pH 5.0 5.0 - 8.0   Glucose, UA 50 (A) NEGATIVE mg/dL   Hgb urine dipstick LARGE (A) NEGATIVE  Bilirubin Urine NEGATIVE NEGATIVE   Ketones, ur NEGATIVE NEGATIVE mg/dL   Protein, ur 100 (A) NEGATIVE mg/dL   Nitrite NEGATIVE NEGATIVE   Leukocytes,Ua NEGATIVE NEGATIVE   RBC / HPF >50 (H) 0 - 5 RBC/hpf   WBC, UA 21-50 0 - 5 WBC/hpf   Bacteria, UA RARE (A) NONE SEEN   WBC Clumps PRESENT    Mucus PRESENT    Hyaline Casts, UA PRESENT    Non Squamous Epithelial 0-5 (A) NONE SEEN    Comment: Performed at [facility], [address], [place], [place] [address]    Ct Abdomen Pelvis Wo Contrast  Result Date: 02/08/2019 CLINICAL DATA:  Abdominal pain. Right lower quadrant abdominal pain. EXAM: CT ABDOMEN AND PELVIS WITHOUT CONTRAST TECHNIQUE: Multidetector CT imaging of the abdomen and pelvis was performed following the standard protocol without IV contrast. COMPARISON:  November 04, 2015 FINDINGS: Lower chest: The lung bases are clear.The heart size is significantly enlarged. There is atelectasis at the lung bases. Hepatobiliary: The liver demonstrates a nodular contour. There is cholelithiasis. Gallbladder wall thickening is noted.There is no biliary ductal dilation. Pancreas: Normal contours without ductal dilatation. No peripancreatic fluid collection. Spleen: The spleen is enlarged measuring 14 cm craniocaudad. Adrenals/Urinary Tract: --Adrenal glands: No adrenal hemorrhage. --Right kidney/ureter: Multiple nonobstructing stones are noted. --Left kidney/ureter: Multiple nonobstructing stones are noted. --Urinary bladder: There is bladder wall thickening which is favored to be secondary to underdistention. There are few pockets of gas within the urinary bladder lumen. Stomach/Bowel: --Stomach/Duodenum: There is diffuse wall thickening of the distal esophagus with a moderate-sized hiatal hernia. There is some mild fat stranding wall thickening of the proximal duodenum. --Small bowel:  There is no small bowel obstruction. There is some wall thickening of multiple loops of jejunum in the mid left abdomen. --Colon: Rectosigmoid diverticulosis without acute inflammation. There is some wall thickening of the cecum. --Appendix: The appendix is not reliably identified. There is a surgical clip in the right lower quadrant which may indicate the patient has undergone prior cholecystectomy. Vascular/Lymphatic: Atherosclerotic calcification is present within the non-aneurysmal abdominal aorta, without hemodynamically significant stenosis. There is portosystemic shunting. --there are few prominent retroperitoneal lymph nodes. --there are borderline enlarged mesenteric lymph nodes. --No pelvic or inguinal lymphadenopathy. Reproductive: The prostate gland is enlarged. Other: There is a small volume of pneumoperitoneum. There is a moderate to large volume of abdominal ascites. There is some nodularity of the omentum. Musculoskeletal. No acute displaced fractures. IMPRESSION: 1. Small volume pneumoperitoneum consistent with a hollow viscus perforation. The exact location of the perforation cannot be determined on this exam. Of note, the patient's CT from 2017 demonstrated findings concerning for a duodenal ulcer. There is some mild wall thickening of the proximal duodenum, however this is not well evaluated in the absence of IV contrast. 2. Cirrhosis with sequela of portal hypertension including splenomegaly, moderate to large volume of abdominal ascites, and portosystemic shunting. 3. Bilateral nonobstructing nephrolithiasis. 4. There is cholelithiasis without secondary signs of acute cholecystitis. 5. Nodularity of the omentum favored to be secondary to the presence of ascites. Follow-up is recommended. Consider fluid sampling of the ascites to help rule out a process such is omental carcinomatosis. 6. Cardiomegaly. 7. Diffuse wall thickening of the distal esophagus may be secondary to esophagitis. A  moderate-sized hiatal hernia is noted. 8. Additional chronic findings as above. These results were called by telephone at the time of interpretation on 02/08/2019 at 12:14 am to provider [facility] , who verbally acknowledged these  results. Electronically Signed   By: Katherine Mantle M.D.   On: 02/08/2019 00:16   Dg Chest Portable 1 View  Result Date: 03-Mar-2019 CLINICAL DATA:  Hypoxia, tachycardia EXAM: PORTABLE CHEST 1 VIEW COMPARISON:  March 08, 2010 FINDINGS: There is moderate cardiomegaly. Aortic knob calcifications. Pulmonary vascular congestion is seen in the perihilar regions. There is a probable trace right and small left pleural effusion. Hazy airspace opacity seen at both lung bases. No acute osseous abnormality. IMPRESSION: Cardiomegaly and pulmonary vascular congestion. Trace right and small left pleural effusion. Hazy airspace opacity at both lung bases which could be layering effusion, early infectious etiology and/or atelectasis. Electronically Signed   By: Jonna Clark M.D.   On: March 03, 2019 23:08    ROS:  Review of systems not obtained due to patient factors.  Blood pressure 112/73, pulse (!) 138, temperature 98.3 F (36.8 C), temperature source Oral, resp. rate (!) 24, height 5\' 5"  (1.651 m), weight 99.3 kg, SpO2 90 %. Physical Exam: Fatigued white male who is lying on his side.  He is holding an emesis bag. Lungs clear auscultation with equal breath sounds bilaterally Heart examination reveals a sinus tachycardia, but regular rhythm Abdomen is significantly distended but actually soft.  Some tense ascites is present, but no rigidity is noted.  He does not describe any particular palpation tenderness, though points to the right side of the abdomen.  Labs and CT scan reviewed  Assessment/Plan: Impression: Perforated viscus, significant cirrhosis with ascites and splenomegaly, COPD, sepsis, renal insufficiency Plan: Patient needs an NG tube placed and fluid  resuscitation.  Antibiotics have been started as per sepsis protocol.  We do not have an ICU bed or the ancillary support to take care of this patient.  This was discussed with the emergency room physician and PA.  He needs a higher level of care than can be offered at [facility].  [facility] 02/08/2019, 1:14 AM

## 2019-02-08 NOTE — Plan of Care (Signed)
Attestation for MICU Admission.   63 yo M presenting with h/o Charcot Marie Tooth and DM presenting with bowel perforation, lactic acidosis, new diagnosis of cirrhosis with ascites, and shock. General surgery has determined that the patient is not a surgical candidate for the bowel perforation due to his cirrhosis which places him at high perioperative mortality risk.  He is in shock and is on peripheral phenyleprine at 200cc/hr.  His work of breathing is likely due to compensation for his anion gap metabolic acidosis.    O:  Vitals: BP 116/75 on 200cc/hr on phenylephrine.  HR 100s.  Lethargic, but able to speak 3 words at a time.   Abdomen taut, nontender. Clear breath sounds on right, diminished on left Neuro exam grossly normal   CT Abdomen/Pelvis 1. Small volume pneumoperitoneum consistent with a hollow viscus perforation. The exact location of the perforation cannot be determined on this exam. Of note, the patient's CT from 2017 demonstrated findings concerning for a duodenal ulcer. There is some mild wall thickening of the proximal duodenum, however this is not well evaluated in the absence of IV contrast. 2. Cirrhosis with sequela of portal hypertension including splenomegaly, moderate to large volume of abdominal ascites, and portosystemic shunting. 3. Bilateral nonobstructing nephrolithiasis. 4. There is cholelithiasis without secondary signs of acute cholecystitis. 5. Nodularity of the omentum favored to be secondary to the presence of ascites. Follow-up is recommended. Consider fluid sampling of the ascites to help rule out a process such is omental carcinomatosis. 6. Cardiomegaly. 7. Diffuse wall thickening of the distal esophagus may be secondary to esophagitis. A moderate-sized hiatal hernia is noted. 8. Additional chronic findings as above.  A/P: # Bowel perforation # Septic shock # Lactic acidosis Currently not a surgical candidate per general surgery.   - continue  abx- cefepime and flagyl - NG tube for decompression - GI consult today - Gen surg continue to follow - trend lactate - trend Bmp - maintenance fluids- gentle - currently patient does not need to be intubated.  He does have some increased work of breathing, but is maintaining his airway.  Tachypnea likely from compensation for acidosis.    # Cirrhosis # Ascites  New dx of cirrhosis.  ?NASH, not much ETOH use per family.   - unfortunately in the setting of a perforated viscous, do not there is much utility in paracentesis for a SAAG - hepatitis panel - RUQ with doppler  - GI consult  # Hyperglycemia - not likely DKA due to no ketones in urine - follow sugars closely, can place on insulin gtt for glucose control if need

## 2019-02-08 NOTE — Procedures (Signed)
Intubation Emergent, consent obtained verbally from son No sedation given Intubated with 8-0 tube without issue No immediate complications CXR pending

## 2019-02-08 NOTE — Progress Notes (Signed)
Unable to obtain ABG per order, RN and MD notified.

## 2019-02-08 NOTE — H&P (Signed)
See consult note last night by Gleason.

## 2019-02-08 NOTE — ED Notes (Signed)
Dr thompson at the bedside 

## 2019-02-08 NOTE — Procedures (Signed)
L femoral arterial line note Using usual sterile technique and US guidance, L femoral artery cannulated, good waveform on monitor, sutured in place and sterile dressing applied  Erskine Emery MD

## 2019-02-09 DIAGNOSIS — K7290 Hepatic failure, unspecified without coma: Secondary | ICD-10-CM

## 2019-02-09 DIAGNOSIS — D689 Coagulation defect, unspecified: Secondary | ICD-10-CM

## 2019-02-09 DIAGNOSIS — R6521 Severe sepsis with septic shock: Secondary | ICD-10-CM

## 2019-02-09 DIAGNOSIS — N171 Acute kidney failure with acute cortical necrosis: Secondary | ICD-10-CM

## 2019-02-09 LAB — GLUCOSE, CAPILLARY
Glucose-Capillary: 127 mg/dL — ABNORMAL HIGH (ref 70–99)
Glucose-Capillary: 147 mg/dL — ABNORMAL HIGH (ref 70–99)
Glucose-Capillary: 49 mg/dL — ABNORMAL LOW (ref 70–99)
Glucose-Capillary: 60 mg/dL — ABNORMAL LOW (ref 70–99)
Glucose-Capillary: 70 mg/dL (ref 70–99)
Glucose-Capillary: 95 mg/dL (ref 70–99)

## 2019-02-09 LAB — POCT I-STAT 7, (LYTES, BLD GAS, ICA,H+H)
Acid-base deficit: 17 mmol/L — ABNORMAL HIGH (ref 0.0–2.0)
Acid-base deficit: 18 mmol/L — ABNORMAL HIGH (ref 0.0–2.0)
Acid-base deficit: 19 mmol/L — ABNORMAL HIGH (ref 0.0–2.0)
Bicarbonate: 9 mmol/L — ABNORMAL LOW (ref 20.0–28.0)
Bicarbonate: 9.4 mmol/L — ABNORMAL LOW (ref 20.0–28.0)
Bicarbonate: 9.9 mmol/L — ABNORMAL LOW (ref 20.0–28.0)
Calcium, Ion: 0.81 mmol/L — CL (ref 1.15–1.40)
Calcium, Ion: 0.85 mmol/L — CL (ref 1.15–1.40)
Calcium, Ion: 0.92 mmol/L — ABNORMAL LOW (ref 1.15–1.40)
HCT: 45 % (ref 39.0–52.0)
HCT: 47 % (ref 39.0–52.0)
HCT: 47 % (ref 39.0–52.0)
Hemoglobin: 15.3 g/dL (ref 13.0–17.0)
Hemoglobin: 16 g/dL (ref 13.0–17.0)
Hemoglobin: 16 g/dL (ref 13.0–17.0)
O2 Saturation: 90 %
O2 Saturation: 92 %
O2 Saturation: 94 %
Patient temperature: 98.6
Patient temperature: 98.8
Potassium: 4.7 mmol/L (ref 3.5–5.1)
Potassium: 4.9 mmol/L (ref 3.5–5.1)
Potassium: 5 mmol/L (ref 3.5–5.1)
Sodium: 130 mmol/L — ABNORMAL LOW (ref 135–145)
Sodium: 131 mmol/L — ABNORMAL LOW (ref 135–145)
Sodium: 131 mmol/L — ABNORMAL LOW (ref 135–145)
TCO2: 10 mmol/L — ABNORMAL LOW (ref 22–32)
TCO2: 10 mmol/L — ABNORMAL LOW (ref 22–32)
TCO2: 11 mmol/L — ABNORMAL LOW (ref 22–32)
pCO2 arterial: 25.8 mmHg — ABNORMAL LOW (ref 32.0–48.0)
pCO2 arterial: 26.8 mmHg — ABNORMAL LOW (ref 32.0–48.0)
pCO2 arterial: 26.9 mmHg — ABNORMAL LOW (ref 32.0–48.0)
pH, Arterial: 7.135 — CL (ref 7.350–7.450)
pH, Arterial: 7.15 — CL (ref 7.350–7.450)
pH, Arterial: 7.192 — CL (ref 7.350–7.450)
pO2, Arterial: 73 mmHg — ABNORMAL LOW (ref 83.0–108.0)
pO2, Arterial: 81 mmHg — ABNORMAL LOW (ref 83.0–108.0)
pO2, Arterial: 84 mmHg (ref 83.0–108.0)

## 2019-02-09 LAB — BASIC METABOLIC PANEL
Anion gap: 29 — ABNORMAL HIGH (ref 5–15)
BUN: 31 mg/dL — ABNORMAL HIGH (ref 8–23)
CO2: 9 mmol/L — ABNORMAL LOW (ref 22–32)
Calcium: 7.2 mg/dL — ABNORMAL LOW (ref 8.9–10.3)
Chloride: 95 mmol/L — ABNORMAL LOW (ref 98–111)
Creatinine, Ser: 3.86 mg/dL — ABNORMAL HIGH (ref 0.61–1.24)
GFR calc Af Amer: 18 mL/min — ABNORMAL LOW (ref >60)
GFR calc non Af Amer: 16 mL/min — ABNORMAL LOW (ref >60)
Glucose, Bld: 68 mg/dL — ABNORMAL LOW (ref 70–99)
Potassium: 5 mmol/L (ref 3.5–5.1)
Sodium: 133 mmol/L — ABNORMAL LOW (ref 135–145)

## 2019-02-09 LAB — HEPATIC FUNCTION PANEL
ALT: 2120 U/L — ABNORMAL HIGH (ref 0–44)
AST: 9410 U/L — ABNORMAL HIGH (ref 15–41)
Albumin: 1.6 g/dL — ABNORMAL LOW (ref 3.5–5.0)
Alkaline Phosphatase: 167 U/L — ABNORMAL HIGH (ref 38–126)
Bilirubin, Direct: 1.6 mg/dL — ABNORMAL HIGH (ref 0.0–0.2)
Indirect Bilirubin: 1.5 mg/dL — ABNORMAL HIGH (ref 0.3–0.9)
Total Bilirubin: 3.1 mg/dL — ABNORMAL HIGH (ref 0.3–1.2)
Total Protein: 5 g/dL — ABNORMAL LOW (ref 6.5–8.1)

## 2019-02-09 LAB — URINE CULTURE

## 2019-02-09 LAB — CBC
HCT: 49.5 % (ref 39.0–52.0)
Hemoglobin: 15 g/dL (ref 13.0–17.0)
MCH: 28.7 pg (ref 26.0–34.0)
MCHC: 30.3 g/dL (ref 30.0–36.0)
MCV: 94.6 fL (ref 80.0–100.0)
Platelets: 287 10*3/uL (ref 150–400)
RBC: 5.23 MIL/uL (ref 4.22–5.81)
RDW: 13.5 % (ref 11.5–15.5)
WBC: 24.7 10*3/uL — ABNORMAL HIGH (ref 4.0–10.5)
nRBC: 1.4 % — ABNORMAL HIGH (ref 0.0–0.2)

## 2019-02-09 LAB — LACTIC ACID, PLASMA: Lactic Acid, Venous: >11 mmol/L (ref 0.5–1.9)

## 2019-02-09 LAB — PROTIME-INR
INR: 4.8 (ref 0.8–1.2)
Prothrombin Time: 44.1 seconds — ABNORMAL HIGH (ref 11.4–15.2)

## 2019-02-09 LAB — ABO/RH: ABO/RH(D): A POS

## 2019-02-09 MED ORDER — SODIUM BICARBONATE 8.4 % IV SOLN
INTRAVENOUS | Status: AC
  Filled 2019-02-09: qty 50

## 2019-02-09 MED ORDER — SODIUM BICARBONATE 8.4 % IV SOLN
100.0000 meq | Freq: Once | INTRAVENOUS | Status: AC
  Administered 2019-02-09: 01:00:00 100 meq via INTRAVENOUS

## 2019-02-09 MED ORDER — PIPERACILLIN-TAZOBACTAM 3.375 G IVPB
3.3750 g | Freq: Two times a day (BID) | INTRAVENOUS | Status: DC
  Filled 2019-02-09: qty 50

## 2019-02-09 MED ORDER — ORAL CARE MOUTH RINSE
15.0000 mL | OROMUCOSAL | Status: DC
  Administered 2019-02-09 (×7): 15 mL via OROMUCOSAL

## 2019-02-09 MED ORDER — ACETAMINOPHEN 325 MG PO TABS: 650.0000 mg | ORAL_TABLET | Freq: Four times a day (QID) | ORAL | Status: DC | PRN

## 2019-02-09 MED ORDER — CHLORHEXIDINE GLUCONATE 0.12% ORAL RINSE (MEDLINE KIT)
15.0000 mL | Freq: Two times a day (BID) | OROMUCOSAL | Status: DC
  Administered 2019-02-09 (×2): 15 mL via OROMUCOSAL

## 2019-02-09 MED ORDER — SODIUM BICARBONATE 8.4 % IV SOLN
100.0000 meq | Freq: Once | INTRAVENOUS | Status: AC
  Administered 2019-02-09: 07:00:00 100 meq via INTRAVENOUS

## 2019-02-09 MED ORDER — DEXTROSE 50 % IV SOLN
INTRAVENOUS | Status: AC
  Filled 2019-02-09: qty 50

## 2019-02-09 MED ORDER — POLYVINYL ALCOHOL 1.4 % OP SOLN
1.0000 [drp] | Freq: Four times a day (QID) | OPHTHALMIC | Status: DC | PRN
  Filled 2019-02-09: qty 15

## 2019-02-09 MED ORDER — GLYCOPYRROLATE 1 MG PO TABS: 1.0000 mg | ORAL_TABLET | ORAL | Status: DC | PRN

## 2019-02-09 MED ORDER — GLYCOPYRROLATE 0.2 MG/ML IJ SOLN: 0.2000 mg | INTRAMUSCULAR | Status: DC | PRN

## 2019-02-09 MED ORDER — DEXTROSE 50 % IV SOLN
50.0000 mL | Freq: Once | INTRAVENOUS | Status: AC
  Administered 2019-02-09: 50 mL via INTRAVENOUS

## 2019-02-09 MED ORDER — VITAMIN K1 10 MG/ML IJ SOLN
10.0000 mg | Freq: Once | INTRAVENOUS | Status: AC
  Administered 2019-02-09: 07:00:00 10 mg via INTRAVENOUS
  Filled 2019-02-09: qty 1

## 2019-02-09 MED ORDER — SODIUM CHLORIDE 0.9% IV SOLUTION: Freq: Once | INTRAVENOUS | Status: DC

## 2019-02-09 MED ORDER — DIPHENHYDRAMINE HCL 50 MG/ML IJ SOLN: 25.0000 mg | INTRAMUSCULAR | Status: DC | PRN

## 2019-02-09 MED ORDER — ACETAMINOPHEN 650 MG RE SUPP: 650.0000 mg | Freq: Four times a day (QID) | RECTAL | Status: DC | PRN

## 2019-02-10 LAB — PREPARE FRESH FROZEN PLASMA
Unit division: 0
Unit division: 0

## 2019-02-10 LAB — BPAM FFP
Blood Product Expiration Date: 202011112359
Blood Product Expiration Date: 202011112359
Blood Product Expiration Date: 202011112359
ISSUE DATE / TIME: 202011061107
ISSUE DATE / TIME: 202011061929
ISSUE DATE / TIME: 202011061929
Unit Type and Rh: 600
Unit Type and Rh: 6200
Unit Type and Rh: 6200

## 2019-02-12 LAB — CULTURE, BLOOD (ROUTINE X 2)
Culture: NO GROWTH
Culture: NO GROWTH
Special Requests: ADEQUATE
Special Requests: ADEQUATE

## 2019-02-17 ENCOUNTER — Telehealth: Payer: Self-pay

## 2019-02-17 NOTE — Telephone Encounter (Signed)
Received dc from [facility].  Dc is for burial and a patient of Doctor Ina Homes.  DC will be to 2 heart for signature.  On 02/20/2019 Received dc back from Doctor Tamala Julian.  I called the funeral home to let them know the dc will be picked up by [place] with [facility].

## 2019-02-19 ENCOUNTER — Ambulatory Visit: Payer: Medicare Other | Admitting: Family Medicine

## 2019-03-04 NOTE — Discharge Summary (Signed)
Date of Admission: 02/08/19 Date of Death: 03/09/2019 Diagnoses: Multiorgan failure due to perforated gastric ulcer in gentleman with cirrhosis of liver Hospital Course: Patient was admitted with rapidly progressive multiorgan failure with imaging showing perforated viscus likely from duodenal ulcer.  The surgical team evaluated him and thought he was too high risk for definitive surgery due to his severity of illness and advanced cirrhosis.  Aggressive care was attempted for one day but he continued to decline.  Family reviewed his advanced directives and all were in agreement to allow him to pass in peace.  Erskine Emery MD PCCM

## 2019-03-04 NOTE — Progress Notes (Signed)
Pharmacy Antibiotic Note  Dwayne Davies. is a 63 y.o. male admitted on 03-03-19 with RLQ pain.  Pharmacy has been consulted for Zosyn dosing for intra-abdominal coverage.  Plan: - Adjust Zosyn to 3.375g IV every 12 hours (infused over 4 hours) - Will continue to follow renal function, culture results, LOT, and antibiotic de-escalation plans     Height: 5\' 5"  (165.1 cm) Weight: 260 lb 5.8 oz (118.1 kg) IBW/kg (Calculated) : 61.5  Temp (24hrs), Avg:98.5 F (36.9 C), Min:98.1 F (36.7 C), Max:98.8 F (37.1 C)  Recent Labs  Lab 2019-03-03 2140 Mar 03, 2019 2156 02/08/19 0019 02/08/19 0646 02/08/19 1434 02/12/2019 0004 02/02/2019 0420  WBC 6.8  --   --  13.9*  --   --  24.7*  CREATININE 2.11*  --   --  2.41*  --   --  3.86*  LATICACIDVEN  --  9.7* 8.3* 8.1* >11.0* >11.0*  --     Estimated Creatinine Clearance: 23.6 mL/min (A) (by C-G formula based on SCr of 3.86 mg/dL (H)).    No Known Allergies  Cefepime 11/6 >> 11/7 Flagyl 11/6 x 1 Zosyn 11/7 >>  11/6 BCx >> ngtd 11/6 UCx >> mult species, needs recollect 11/7 COVID >> neg  Thank you for allowing pharmacy to be a part of this patient's care.  Alycia Rossetti, PharmD, BCPS Clinical Pharmacist Clinical phone for 02/28/2019: [phone] 02/08/2019 7:47 AM   **Pharmacist phone directory can now be found on [email or web address] ([email or web address]).  Listed under [place].

## 2019-03-04 NOTE — Progress Notes (Signed)
   NAME:  Dwayne Parcell., MRN:  [id number], DOB:  1955/08/28, LOS: 1 ADMISSION DATE:  02-15-19, CONSULTATION DATE:  02/08/2019 REFERRING MD:  Dr. Tomi Bamberger, CHIEF COMPLAINT:  Abdominal pain  Brief History   63 y.o. M with PMH of COPD, Type 2 DM, GERD, Chronic pain who presented with 2 days of nausea/vomiting, fever and RLQ abdominal pain.  He was hyperglycemic, hypoxic and hypotensive in the ED.  CT abd/pelvis revealed small pneumoperitoneum, cirrhosis, possible omental carcinomatosis.   Surgery was consulted and did not feel that patient was a surgical candidate secondary to high peri-operative mortality risk and low suspicion for  Peritonitis and recommended ED to ED transfer for CCM admission      History of present illness   Dwayne Davies is a 63 y.o. M with  PMH of Type 2 DM, GERD, chronic pain, GERD , Renal stones who had several days   Past Medical History   has a past medical history of Anxiety, Charcot-Marie-Tooth disease, Chest pain, Chronic back pain, Chronic pain in left shoulder, COPD (chronic obstructive pulmonary disease) ([place]), Diabetes mellitus, Elevated lipids, GERD (gastroesophageal reflux disease), Gout, Gout, History of kidney stones, Hypertension, Normal cardiac stress test (2000, 2005), and Shortness of breath.   Significant Hospital Events   11/7 Admit to PCCM  Consults:  PCCM  GI  Surgery  Procedures:    Significant Diagnostic Tests:  11/7 CT abd/pelvis>>Small volume pneumoperitoneum consistent with a hollow viscus Perforation, Cirrhosis with sequela of portal hypertension including Splenomegaly, Consider fluid sampling of theascites to help rule out a process such is omental carcinomatosis  Micro Data:  11/7 Sars-CoV-2>> 11/7 UC>> 11/7 BCx2>>  Antimicrobials:  Zosyn 11/8>>  Interim history/subjective:  Deteriorating Refractory acidosis, hypoglycemia Shock liver, coagulopathy Anuric  Objective   Blood pressure (!) 81/16, pulse (!) 106, temperature 98.8  F (37.1 C), temperature source Oral, resp. rate (!) 28, height 5\' 5"  (1.651 m), weight 118.1 kg, SpO2 95 %. CVP:  [13 mmHg-14 mmHg] 13 mmHg  Vent Mode: PRVC FiO2 (%):  [90 %-100 %] 90 % Set Rate:  [22 bmp-28 bmp] 28 bmp Vt Set:  [650 mL] 650 mL PEEP:  [12 cmH20] 12 cmH20 Plateau Pressure:  [15 cmH20-33 cmH20] 33 cmH20   Intake/Output Summary (Last 24 hours) at 02/24/2019 0723 Last data filed at 02/27/2019 0600 Gross per 24 hour  Intake 11169.69 ml  Output 0 ml  Net 11169.69 ml   Filed Weights   02/15/2019 2114 02/08/19 1100 02/04/2019 0500  Weight: 99.3 kg 106 kg 118.1 kg   GEN: obtunded man on vent HEENT: ETT with minimal secretions CV: RRR, ext warm PULM:Diminished bases, scattered rhonci GI: Protuberant, +ascites on [place] EXT: Trace edema NEURO: Withdraws to pain x 4 PSYCH: deferred SKIN: [facility] Problem list     Assessment & Plan:  Worsening multiorgan failure (brain, liver, skin, heme, kidney, respiratory, GI, cardiac) due to perforated duodenal ulcer in patient with previously unknown cirrhosis.  Not operative candidate.  Further deterioration despite aggressive care last night portends fatal diagnosis.  Further aggressive care is now for family rather than patient as death is imminent. - Family to come in - Continue current level of care in interim - Not dialysis candidate - Will advocate allowing Dwayne Davies to pass in peace once family comes in  Erskine Emery MD 34 minutes critical care time

## 2019-03-04 NOTE — Progress Notes (Signed)
Medications taken down and appropriately wasted and all lines were removed from patient.   Dewaine Oats, RN

## 2019-03-04 NOTE — Progress Notes (Signed)
eLink Physician-Brief Progress Note Patient Name: Jeovanni Heuring. DOB: March 16, 1956 MRN: [id number]   Date of Service  02/14/2019  HPI/Events of Note  Coagulopathy - INR = 4.8, Hypoglycemia - Blood glucose = 60.   eICU Interventions  Will order: 1. Transfuse 3 units FFP. 2. Vitamin K 10 mg IV now.  3. Increase D10W to 125 mL/hour. 4. NaHCO3 100 meq IV now.  5. Repeat ABG at 8 AM.      Intervention Category Major Interventions: Acid-Base disturbance - evaluation and management;Respiratory failure - evaluation and management;Other: Intermediate Interventions: Coagulopathy - evaluation and management  Miniya Miguez Eugene 02/23/2019, 6:09 AM

## 2019-03-04 NOTE — Progress Notes (Signed)
eLink Physician-Brief Progress Note Patient Name: Dwayne Davies. DOB: 1956-02-16 MRN: [id number]   Date of Service  02/03/2019  HPI/Events of Note  Multiple issues: 1. Hypoglycemia - Blood glucose = 49. Already given D50 and 2. ABG = 7.19/25/84/9.9.  eICU Interventions  Will order: 1. Increase D10W IV infusion to 100 mL/hour. 2. NaHCO3 100 meq IV now.  3. Repeat ABG at 5 AM.      Intervention Category Major Interventions: Acid-Base disturbance - evaluation and management;Other:  Sommer,Steven Cornelia Copa 02/22/2019, 1:18 AM

## 2019-03-04 NOTE — Progress Notes (Signed)
CRITICAL VALUE ALERT  Critical Value:  PH 7.19, paCO2 25.8, paO2 84, HCO3 9.9  Date & Time Notied:  01-Mar-2019 0115  Provider Notified: Dr. Emmit Alexanders  Orders Received/Actions taken: 2amps of bicarb

## 2019-03-04 NOTE — Progress Notes (Signed)
CRITICAL VALUE ALERT  Critical Value:  CBG 60 on D10 infusion, post D50 IVP CBG 127  Date & Time Notied: 03/03/2019 0553  Provider Notified: Dr. Emmit Alexanders via [place] RN  Orders Received/Actions taken: awaiting new orders

## 2019-03-04 NOTE — Progress Notes (Addendum)
Family Meeting Note Met with multiple family members regarding worsening multiorgan failure.  They will discuss among themselves and let us know what they would like to do going forward.  Patient is DNR.  Erskine Emery MD

## 2019-03-04 NOTE — Progress Notes (Signed)
CRITICAL VALUE ALERT  Critical Value:  INR 4.8  Date & Time Notied:  02/08/2019 0553  Provider Notified: Dr. Emmit Alexanders via [place] RN  Orders Received/Actions taken: awaiting new order

## 2019-03-04 NOTE — Progress Notes (Signed)
eLink Physician-Brief Progress Note Patient Name: Charlee Whitebread. DOB: Apr 18, 1955 MRN: [id number]   Date of Service  03/01/2019  HPI/Events of Note  Multiple issues: 1 Oliguria and 2. Last Lactic Acid > 11.0 - Request for repeat Lactic Acid.   eICU Interventions  Will order: 1. Monitor CVP now and Q 4 hours.  2. Lactic Acid level now and at 5 AM.     Intervention Category Major Interventions: Acid-Base disturbance - evaluation and management Intermediate Interventions: Oliguria - evaluation and management  Alvino Lechuga Eugene 02/03/2019, 12:02 AM

## 2019-03-04 NOTE — Progress Notes (Signed)
Patient ID: Dwayne Davies., male   DOB: [birth year], 63 y.o.   MRN: [id number] I D/W Dr. Tamala Julian from [place]. Progressive multiorgan failure. Family coming in for likely transition to comfort care. We will sign off.  Georganna Skeans, MD, MPH, FACS Please use [email or web address] to contact on call provider

## 2019-03-04 NOTE — Progress Notes (Signed)
CRITICAL VALUE ALERT  Critical Value:  PH 7.13, paCO2 26.8, paO2 81, HCO3 9.0  Date & Time Notied:  02-12-2019 0553  Provider Notified: Dr. Emmit Alexanders via [place] RN  Orders Received/Actions taken: awaiting new order

## 2019-03-04 NOTE — Progress Notes (Signed)
16:10 listened for heart sounds and heard none. Pupils nonreactive, no movement of extremities. 2 RN's called time of death, myself and Lavone Neri. Declared time of death 16:10.

## 2019-03-04 NOTE — Progress Notes (Signed)
CRITICAL VALUE ALERT  Critical Value:  CBG 49 on D10 infusion, post D50 IVP CBG 147  Date & Time Notied:  02/20/19 0115  Provider Notified: Dr. Emmit Alexanders  Orders Received/Actions taken: increase D10 rate

## 2019-03-04 DEATH — deceased

## 2021-07-05 IMAGING — CT CT ABD-PELV W/O CM
2 of 4 series · 15 of 46 positions shown, 17 images · non-contrast
Comparison: November 04, 2015

CLINICAL DATA: Abdominal pain. Right lower quadrant abdominal pain.

EXAM:
CT ABDOMEN AND PELVIS WITHOUT CONTRAST
TECHNIQUE: Multidetector CT imaging of the abdomen and pelvis was performed
following the standard protocol without IV contrast.

[Series 2: axial st · axial · 0.94mm/px · z∈[+855,+1265]mm · 12 of 94 slices shown, 14 images]
[im 6/94  soft-tissue]
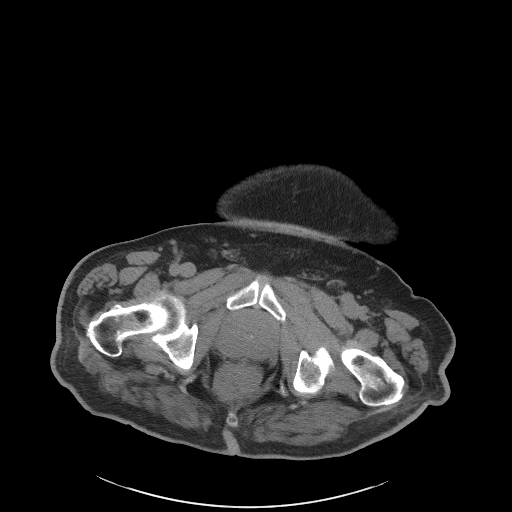
[im 6/94  bone]
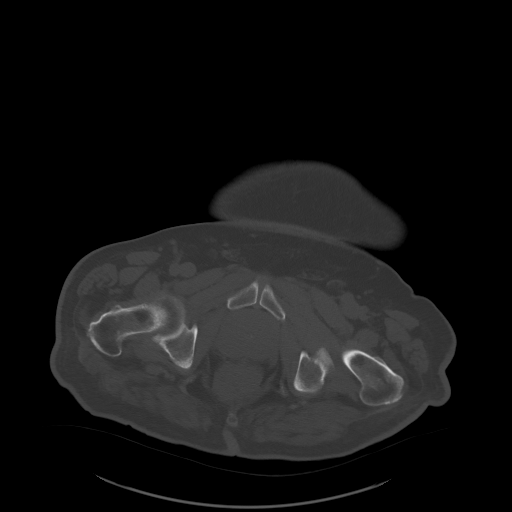
[im 12/94  soft-tissue]
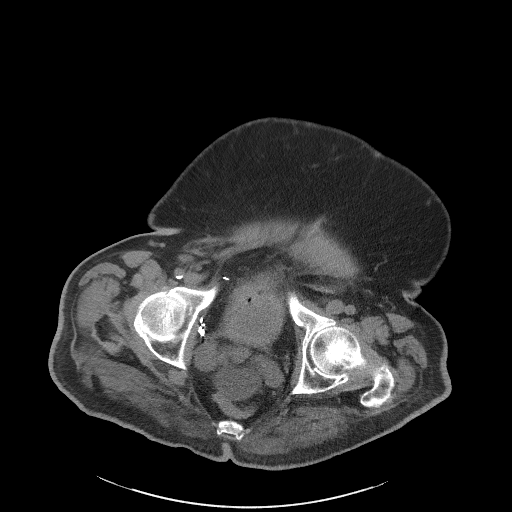
[im 24/94  soft-tissue]
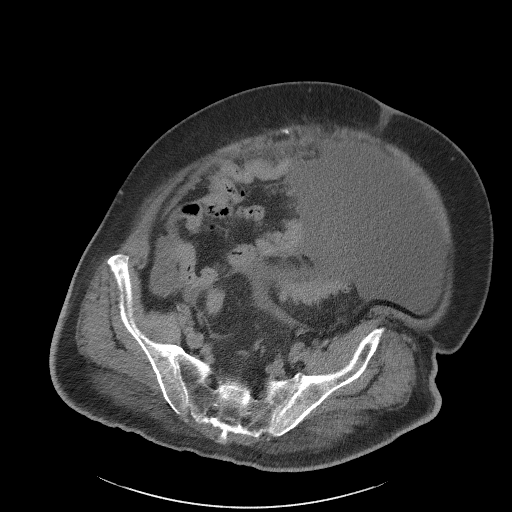
[im 30/94  soft-tissue]
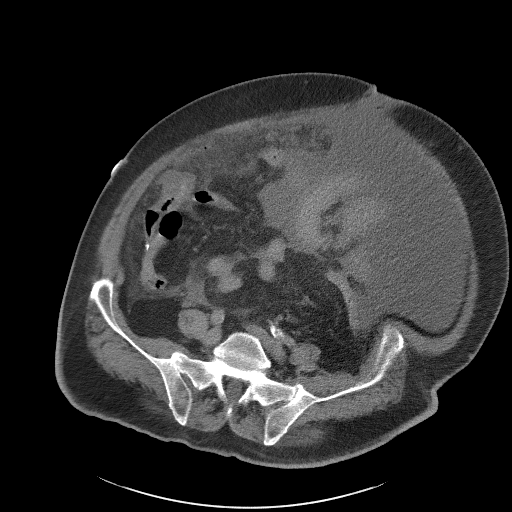
[im 35/94  soft-tissue]
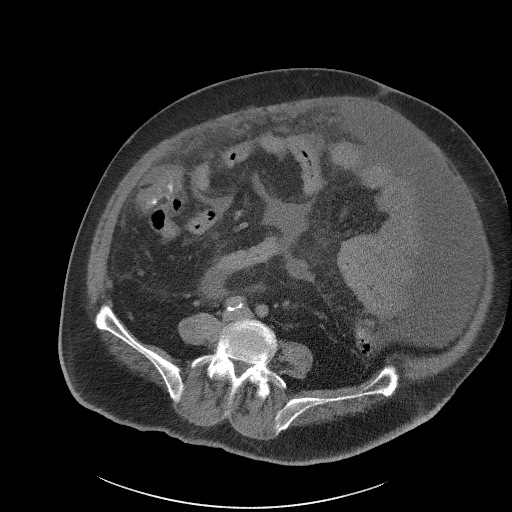
[im 41/94  soft-tissue]
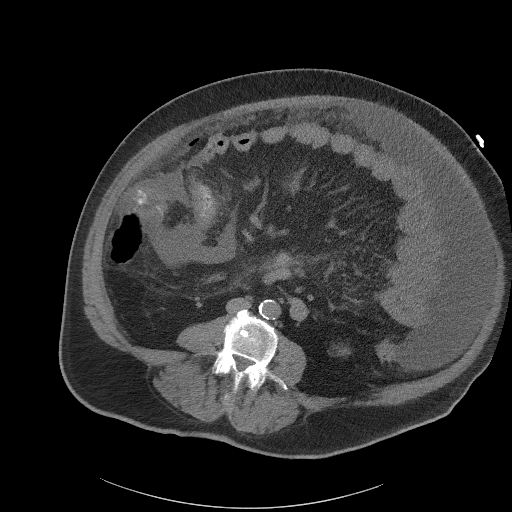
[im 53/94  soft-tissue]
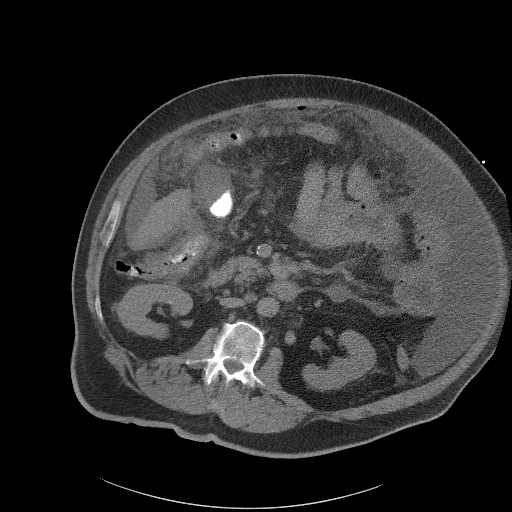
[im 59/94  soft-tissue]
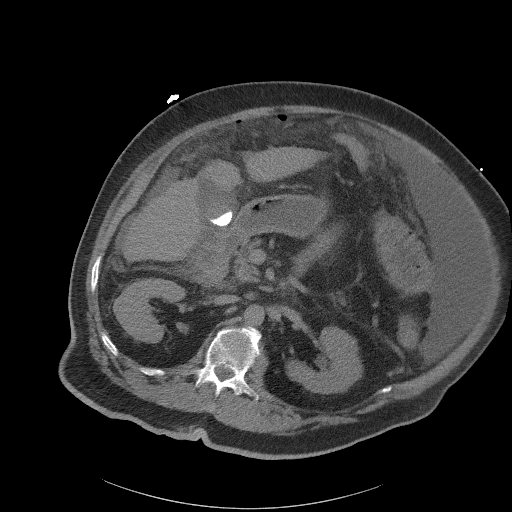
[im 64/94  soft-tissue]
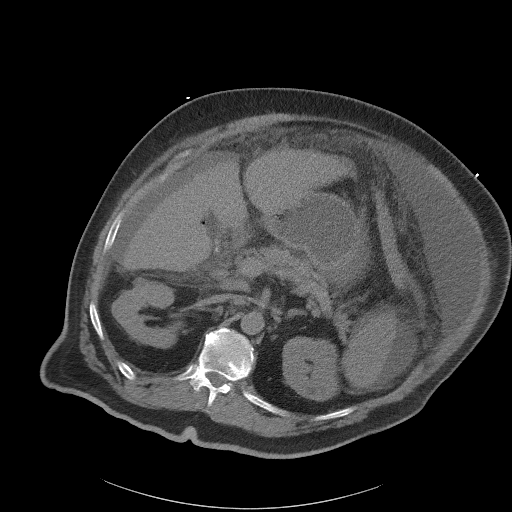
[im 64/94  bone]
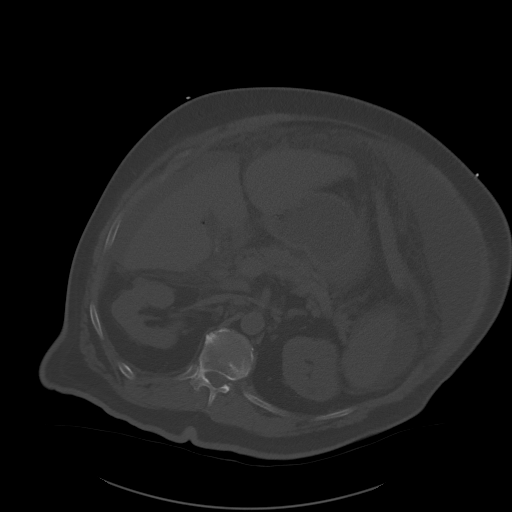
[im 70/94  soft-tissue]
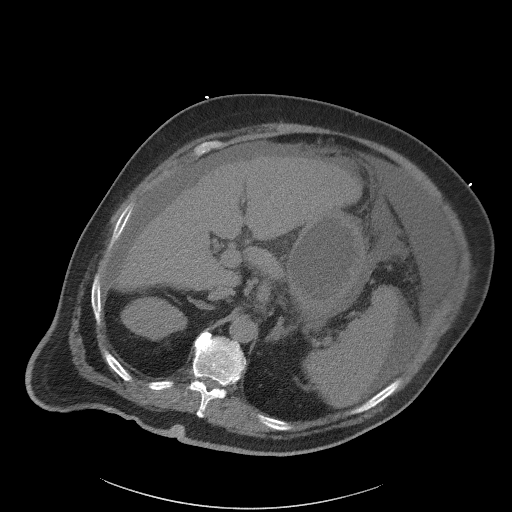
[im 82/94  soft-tissue]
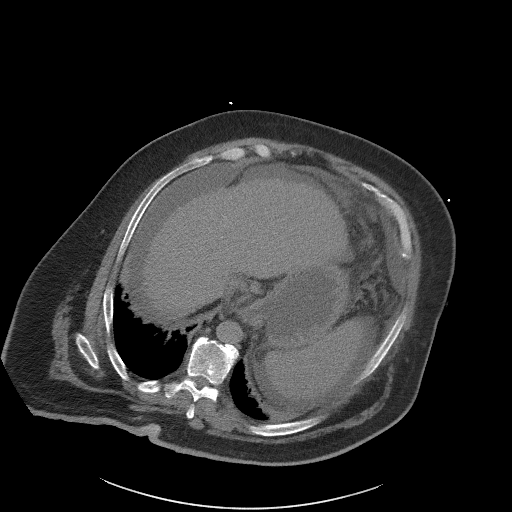
[im 88/94  soft-tissue]
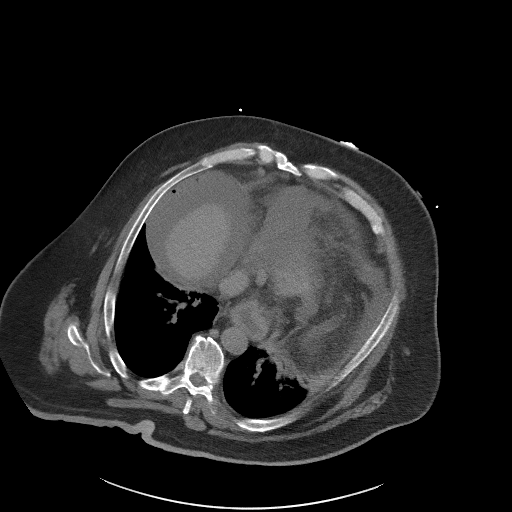

[Series 5: coronal st · coronal · 0.91mm/px · 3 of 143 slices shown]
[im 48/143  soft-tissue]
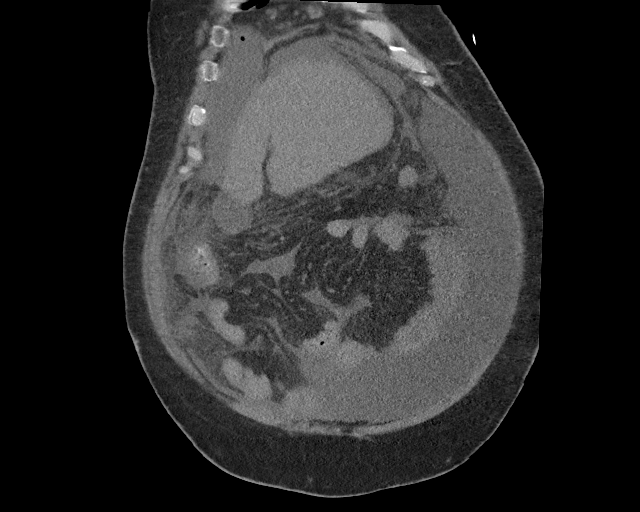
[im 64/143  soft-tissue]
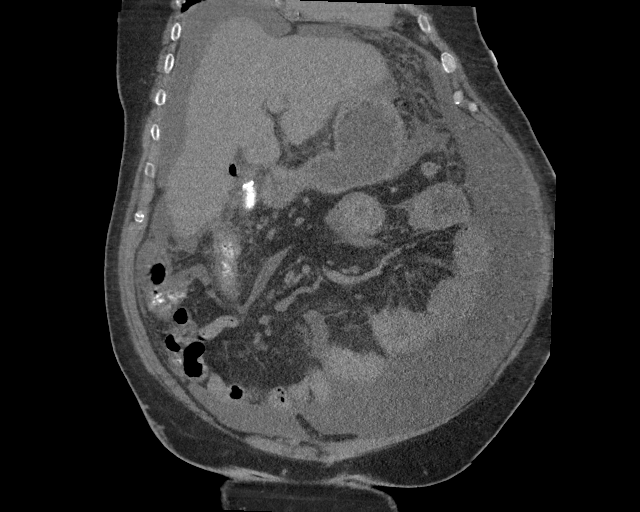
[im 79/143  soft-tissue]
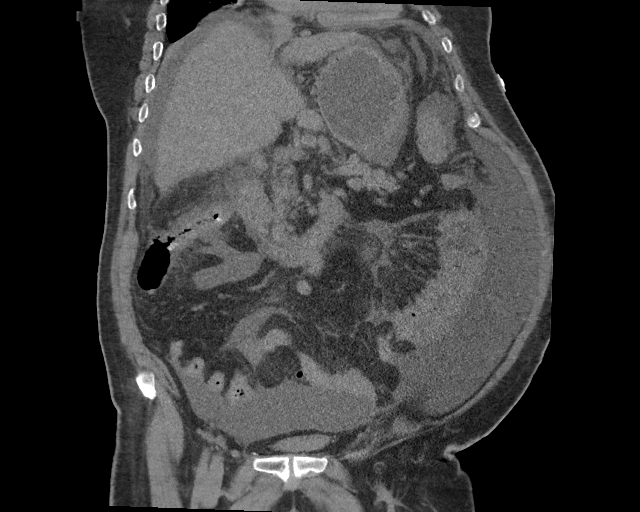

[15 of 46 positions shown; findings below may reference images not displayed]

FINDINGS: Lower chest: The lung bases are clear.The heart size is
significantly enlarged. There is atelectasis at the lung bases.

Hepatobiliary: The liver demonstrates a nodular contour. There is
cholelithiasis. Gallbladder wall thickening is noted.There is no
biliary ductal dilation.

Pancreas: Normal contours without ductal dilatation. No
peripancreatic fluid collection.

Spleen: The spleen is enlarged measuring 14 cm craniocaudad.

Adrenals/Urinary Tract:

--Adrenal glands: No adrenal hemorrhage.

--Right kidney/ureter: Multiple nonobstructing stones are noted.

--Left kidney/ureter: Multiple nonobstructing stones are noted.

--Urinary bladder: There is bladder wall thickening which is favored
to be secondary to underdistention. There are few pockets of gas
within the urinary bladder lumen.

Stomach/Bowel:

--Stomach/Duodenum: There is diffuse wall thickening of the distal
esophagus with a moderate-sized hiatal hernia. There is some mild
fat stranding wall thickening of the proximal duodenum.

--Small bowel: There is no small bowel obstruction. There is some
wall thickening of multiple loops of jejunum in the mid left
abdomen.

--Colon: Rectosigmoid diverticulosis without acute inflammation.
There is some wall thickening of the cecum.

--Appendix: The appendix is not reliably identified. There is a
surgical clip in the right lower quadrant which may indicate the
patient has undergone prior cholecystectomy.

Vascular/Lymphatic: Atherosclerotic calcification is present within
the non-aneurysmal abdominal aorta, without hemodynamically
significant stenosis. There is portosystemic shunting.

--there are few prominent retroperitoneal lymph nodes.

--there are borderline enlarged mesenteric lymph nodes.

--No pelvic or inguinal lymphadenopathy.

Reproductive: The prostate gland is enlarged.

Other: There is a small volume of pneumoperitoneum. There is a
moderate to large volume of abdominal ascites. There is some
nodularity of the omentum.

Musculoskeletal. No acute displaced fractures.
IMPRESSION: 1. Small volume pneumoperitoneum consistent with a hollow viscus
perforation. The exact location of the perforation cannot be
determined on this exam. Of note, the patient's CT from 5590
demonstrated findings concerning for a duodenal ulcer. There is some
mild wall thickening of the proximal duodenum, however this is not
well evaluated in the absence of IV contrast.
2. Cirrhosis with sequela of portal hypertension including
splenomegaly, moderate to large volume of abdominal ascites, and
portosystemic shunting.
3. Bilateral nonobstructing nephrolithiasis.
4. There is cholelithiasis without secondary signs of acute
cholecystitis.
5. Nodularity of the omentum favored to be secondary to the presence
of ascites. Follow-up is recommended. Consider fluid sampling of the
ascites to help rule out a process such is omental carcinomatosis.
6. Cardiomegaly.
7. Diffuse wall thickening of the distal esophagus may be secondary
to esophagitis. A moderate-sized hiatal hernia is noted.
8. Additional chronic findings as above.

These results were called by telephone at the time of interpretation
on 02/08/2019 at [DATE] to provider LINCO SIMMONDS , who verbally
acknowledged these results.

## 2021-07-06 IMAGING — US US ABDOMEN LIMITED
1 series · 14 of 25 positions shown · non-contrast
Comparison: Noncontrast CT today.

CLINICAL DATA: Cirrhosis, evaluate for portal vein thrombosis.

EXAM:
ULTRASOUND ABDOMEN LIMITED RIGHT UPPER QUADRANT

[Series 1: us abdomen limited · 48 acquisitions, 14 frames shown]
[im 1/48]
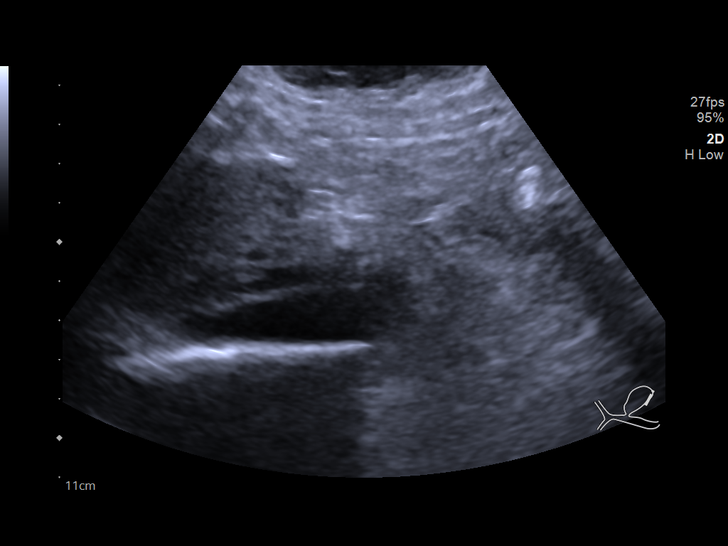
[im 4/48]
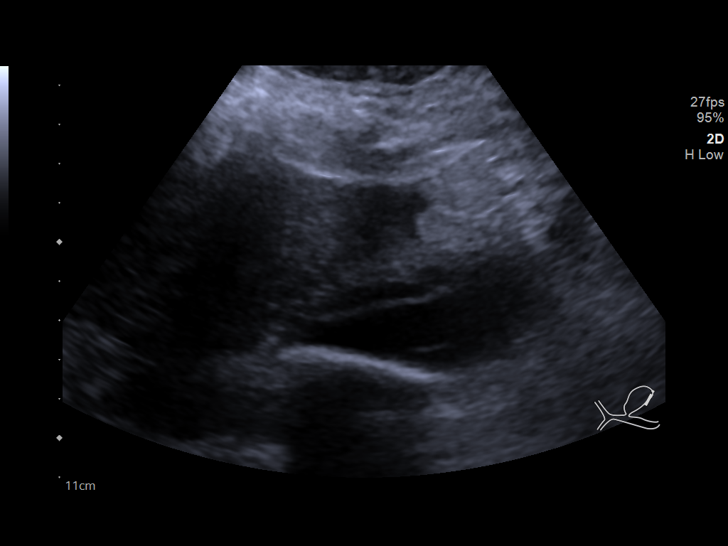
[im 8/48]
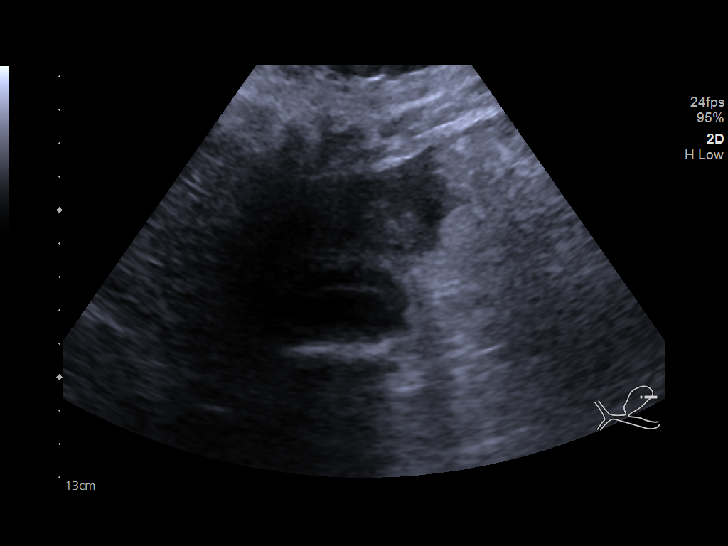
[im 12/48]
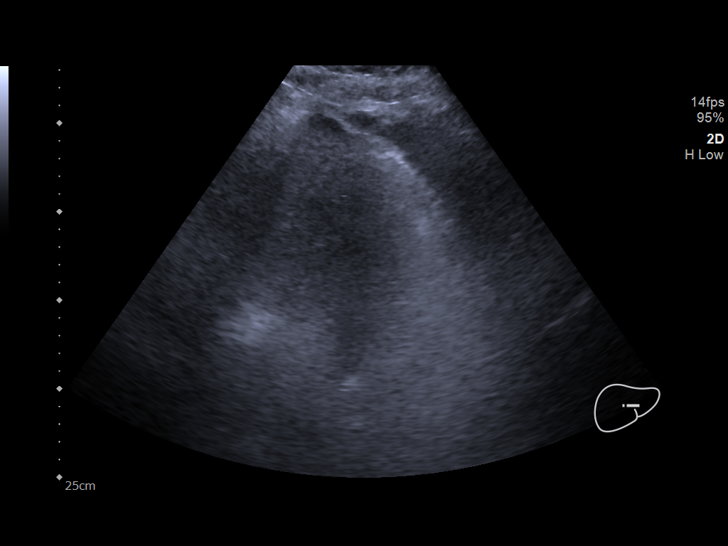
[im 16/48]
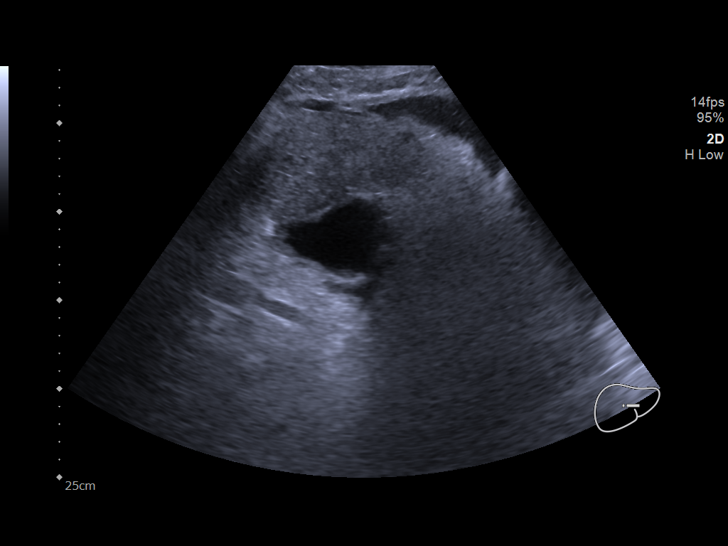
[im 18/48]
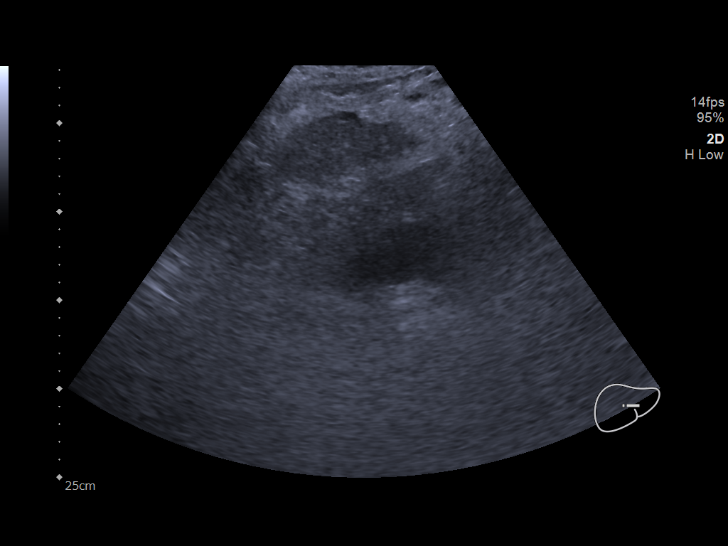
[im 22/48]
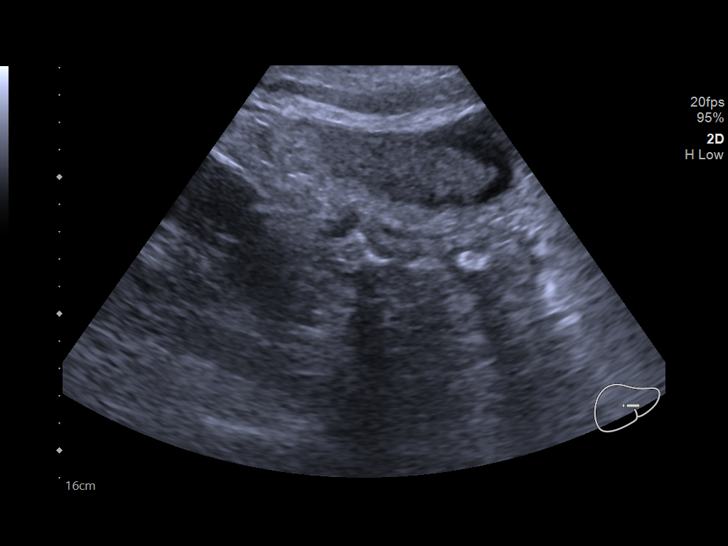
[im 26/48]
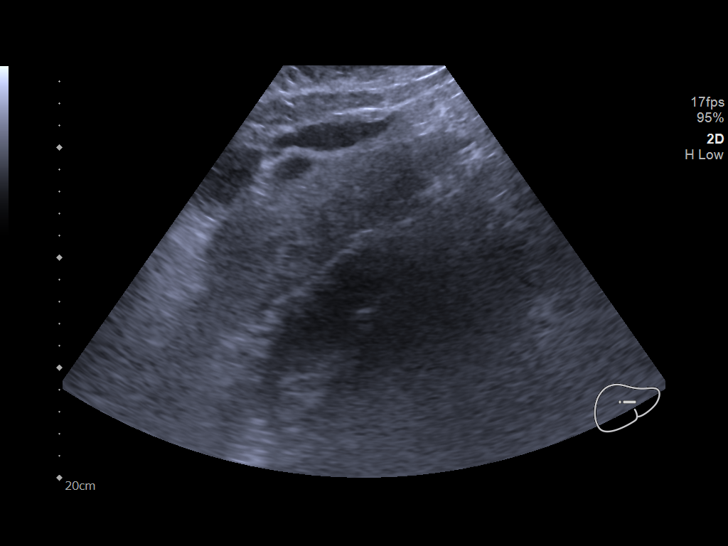
[im 30/48]
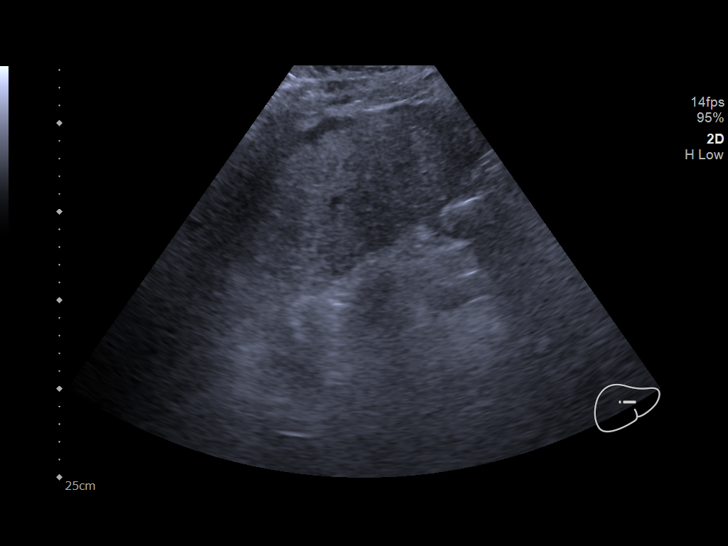
[im 32/48]
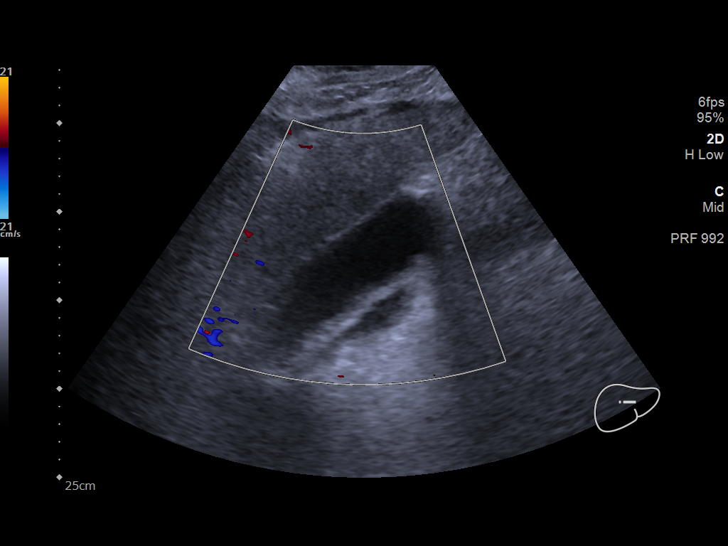
[im 36/48]
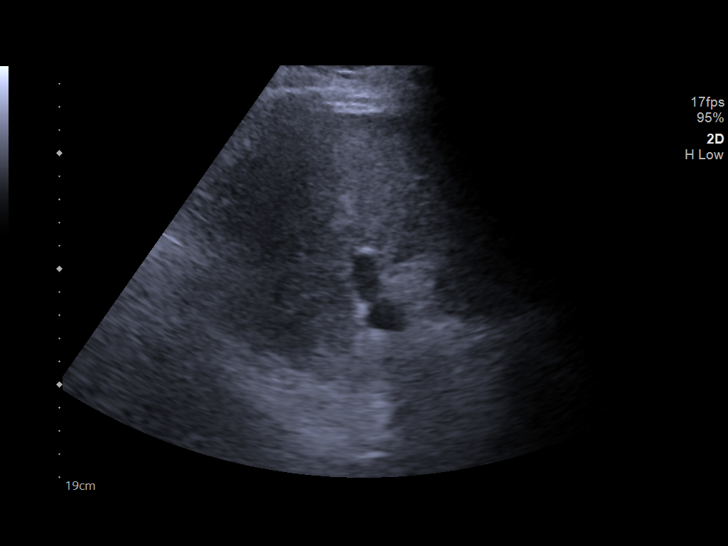
[im 40/48]
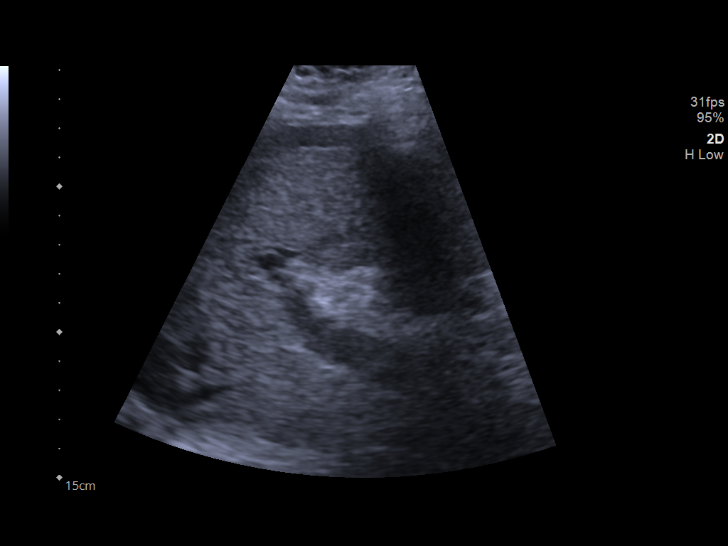
[im 44/48]
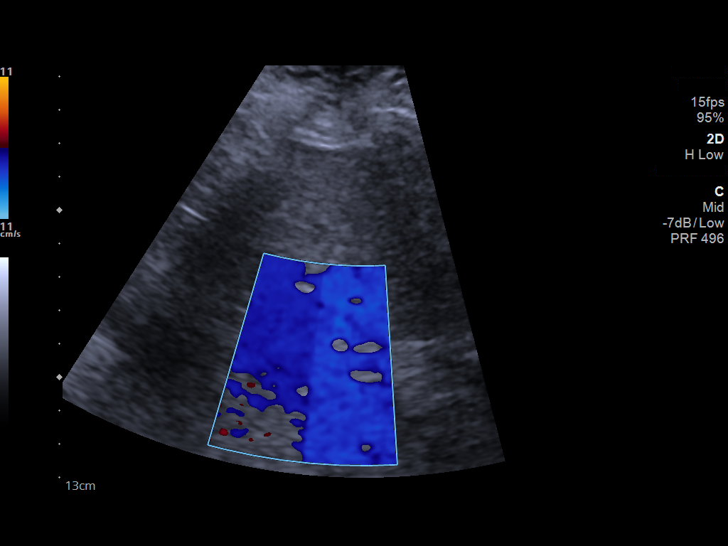
[im 48/48]
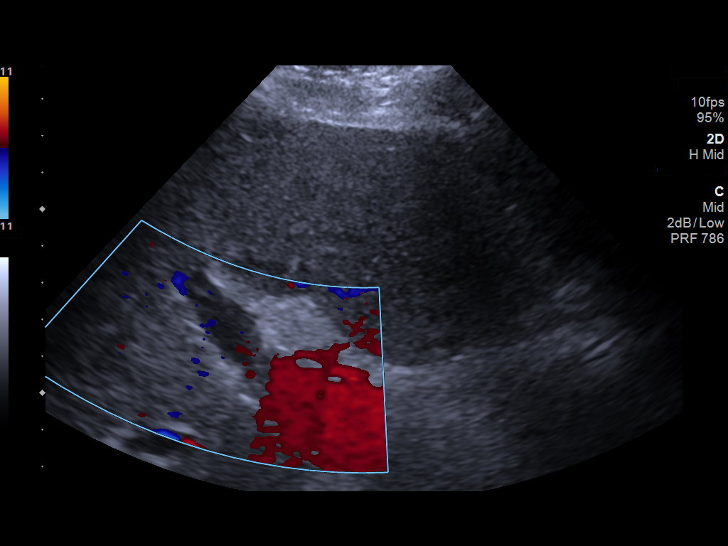

[14 of 25 positions shown; findings below may reference images not displayed]

FINDINGS: Gallbladder:

Mild cholelithiasis. Nonspecific wall thickening measuring 1.1 cm
in the setting of ascites. Negative sonographic Murphy sign.

Common bile duct:

Diameter: 4.9 mm.

Liver:

Increased parenchymal echogenicity compatible with
steatosis/cirrhosis without focal mass. No color Doppler within the
main and right portal veins. Normal antegrade color Doppler within
the left portal vein. Doppler signal within the main and right
portal vein above and below baseline choose extending stagnant flow
and less likely thrombosis.

Other: Ascites.
IMPRESSION: 1. Mild cholelithiasis. Nonspecific gallbladder wall thickening in
the setting of ascites. Recommend clinical correlation for
cholecystitis.

2. Evidence of patient's known cirrhosis without focal mass. Absent
color flow over the main and right portal veins with Doppler signal
present above and below baseline suggesting stagnant flow and less
likely thrombus. Normal flow over the antegrade flow within the left
portal vein.

## 2021-07-06 IMAGING — DX DG CHEST 1V
1 series · 1 of 1 positions shown · non-contrast
Comparison: Chest radiograph 02/08/2019

CLINICAL DATA: Reason for exam: hx of ETT

EXAM:
CHEST  1 VIEW

[chest]
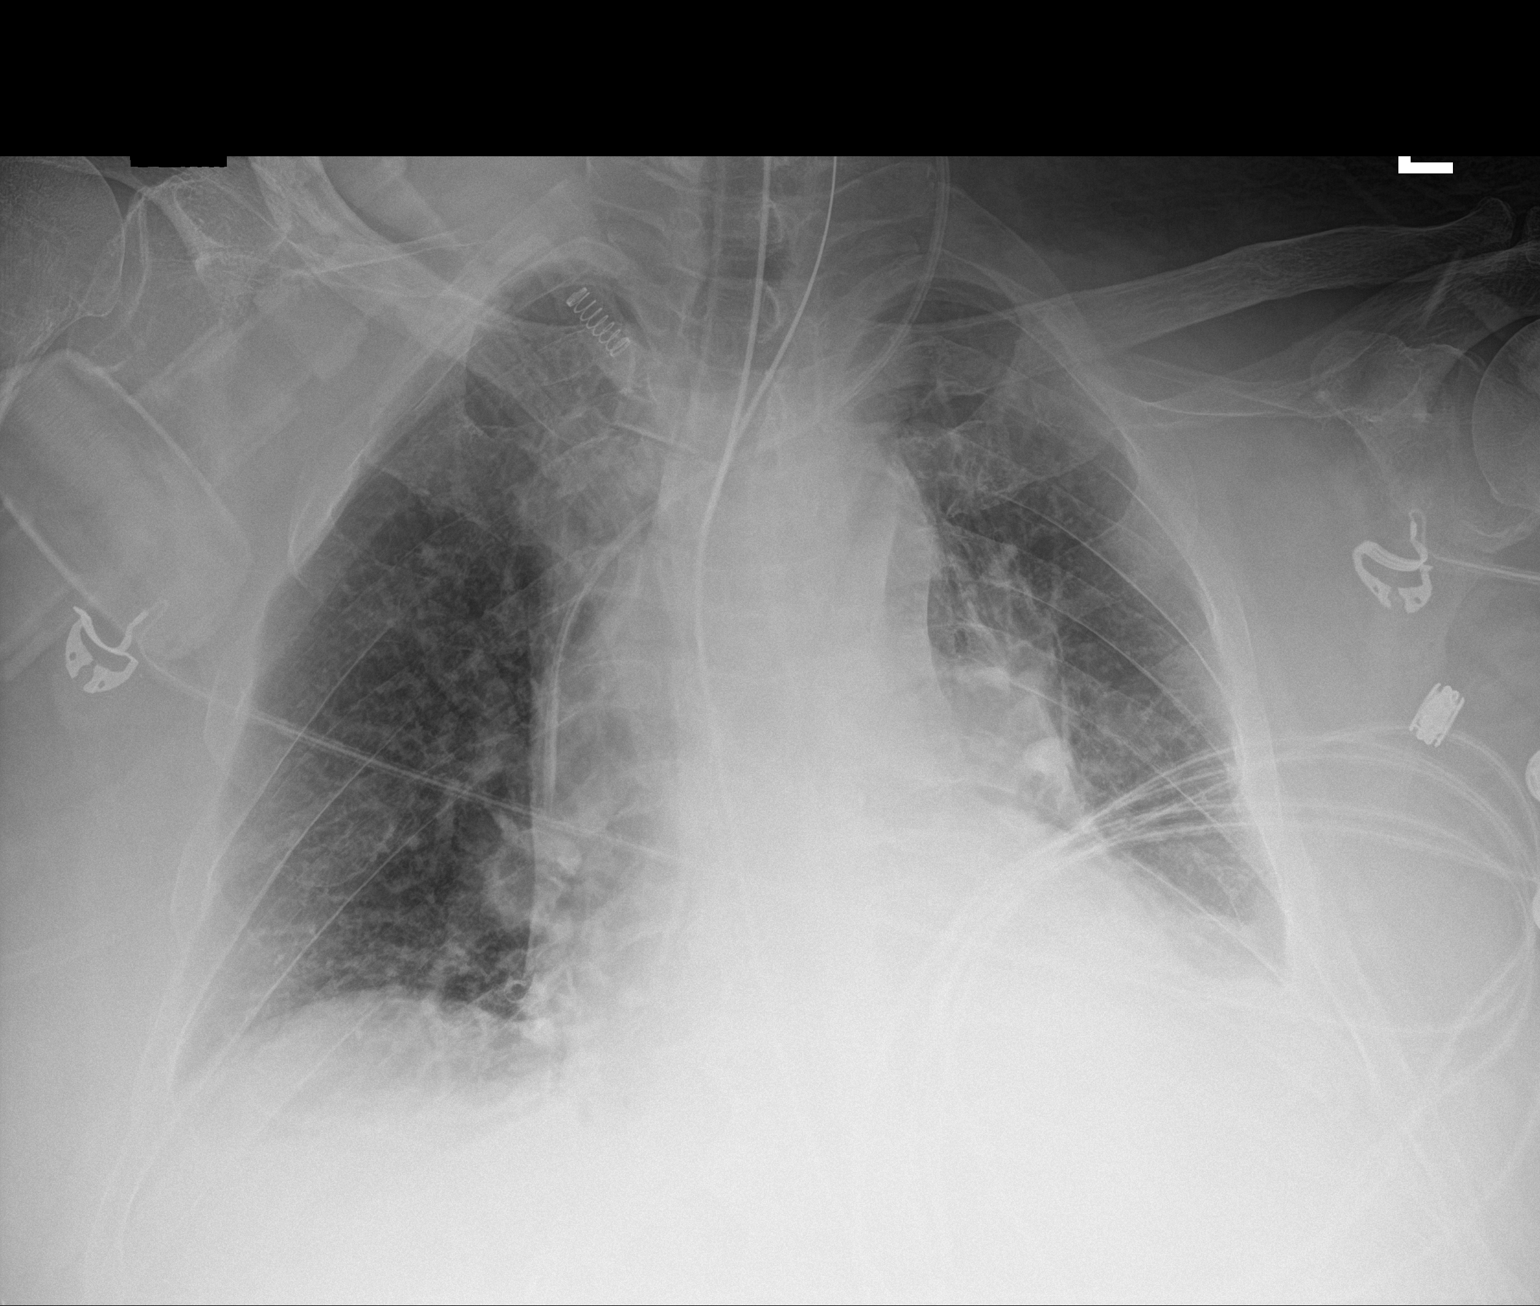

[1 of 1 positions shown; findings below may reference images not displayed]

FINDINGS: Interval intubation with endotracheal tube tip between the thoracic
inlet and carina. Interval placement of a left central venous
catheter with tip projecting over the cavoatrial junction. Of note
the proximal aspect of the left central venous catheter appears
looped once in the neck. A nasogastric tube courses below the
diaphragm. Stable cardiomediastinal contours. Central vascular
congestion. Decreased left basilar consolidation likely representing
atelectasis and small volume pleural fluid. No pneumothorax.
IMPRESSION: 1. Interval placement of a left central venous catheter with tip
projecting over the cavoatrial junction. Of note the proximal aspect
of the left ventral venous catheter appears looped once in the neck,
of uncertain significance.
2. Decreased left basilar consolidation likely representing
atelectasis and small volume pleural fluid.
